# Patient Record
Sex: Female | Born: 1950 | Race: White | Hispanic: No | Marital: Married | Smoking: Never smoker
Health system: Southern US, Community
[De-identification: ages and names within clinical notes are randomized; demographics above are authoritative.]

## PROBLEM LIST (undated history)

## (undated) DIAGNOSIS — E7800 Pure hypercholesterolemia, unspecified: Secondary | ICD-10-CM

## (undated) HISTORY — PX: APPENDECTOMY: SHX54

## (undated) HISTORY — PX: NECK SURGERY: SHX720

## (undated) HISTORY — PX: SHOULDER SURGERY: SHX246

---

## 1997-08-03 ENCOUNTER — Inpatient Hospital Stay (HOSPITAL_COMMUNITY): Admission: EM | Admit: 1997-08-03 | Discharge: 1997-08-07 | Payer: Self-pay | Admitting: Emergency Medicine

## 1998-01-08 ENCOUNTER — Other Ambulatory Visit: Admission: RE | Admit: 1998-01-08 | Discharge: 1998-01-08 | Payer: Self-pay | Admitting: Obstetrics and Gynecology

## 1999-02-06 ENCOUNTER — Other Ambulatory Visit: Admission: RE | Admit: 1999-02-06 | Discharge: 1999-02-06 | Payer: Self-pay | Admitting: Obstetrics and Gynecology

## 2000-02-11 ENCOUNTER — Other Ambulatory Visit: Admission: RE | Admit: 2000-02-11 | Discharge: 2000-02-11 | Payer: Self-pay | Admitting: Obstetrics and Gynecology

## 2001-02-10 ENCOUNTER — Other Ambulatory Visit: Admission: RE | Admit: 2001-02-10 | Discharge: 2001-02-10 | Payer: Self-pay | Admitting: Obstetrics and Gynecology

## 2004-04-28 ENCOUNTER — Other Ambulatory Visit: Admission: RE | Admit: 2004-04-28 | Discharge: 2004-04-28 | Payer: Self-pay | Admitting: Obstetrics and Gynecology

## 2005-11-24 ENCOUNTER — Other Ambulatory Visit: Admission: RE | Admit: 2005-11-24 | Discharge: 2005-11-24 | Payer: Self-pay | Admitting: Obstetrics and Gynecology

## 2013-07-10 ENCOUNTER — Emergency Department (HOSPITAL_COMMUNITY)
Admission: EM | Admit: 2013-07-10 | Discharge: 2013-07-11 | Disposition: A | Discharge status: Home / Self Care | Payer: BC Managed Care – PPO | Attending: Emergency Medicine | Admitting: Emergency Medicine

## 2013-07-10 ENCOUNTER — Encounter (HOSPITAL_COMMUNITY): Payer: Self-pay | Admitting: Emergency Medicine

## 2013-07-10 ENCOUNTER — Emergency Department (HOSPITAL_COMMUNITY): Payer: BC Managed Care – PPO

## 2013-07-10 DIAGNOSIS — R1013 Epigastric pain: Secondary | ICD-10-CM | POA: Insufficient documentation

## 2013-07-10 DIAGNOSIS — E7800 Pure hypercholesterolemia, unspecified: Secondary | ICD-10-CM | POA: Insufficient documentation

## 2013-07-10 DIAGNOSIS — M79609 Pain in unspecified limb: Secondary | ICD-10-CM | POA: Insufficient documentation

## 2013-07-10 DIAGNOSIS — R079 Chest pain, unspecified: Secondary | ICD-10-CM | POA: Insufficient documentation

## 2013-07-10 HISTORY — DX: Pure hypercholesterolemia, unspecified: E78.00

## 2013-07-10 LAB — BASIC METABOLIC PANEL
BUN: 14 mg/dL (ref 6–23)
CHLORIDE: 104 meq/L (ref 96–112)
CO2: 25 mEq/L (ref 19–32)
Calcium: 9.5 mg/dL (ref 8.4–10.5)
Creatinine, Ser: 0.58 mg/dL (ref 0.50–1.10)
GFR calc Af Amer: >90 mL/min (ref >90)
GFR calc non Af Amer: >90 mL/min (ref >90)
Glucose, Bld: 114 mg/dL — ABNORMAL HIGH (ref 70–99)
Potassium: 3.6 mEq/L — ABNORMAL LOW (ref 3.7–5.3)
Sodium: 144 mEq/L (ref 137–147)

## 2013-07-10 LAB — HEPATIC FUNCTION PANEL
ALT: 11 U/L (ref 0–35)
AST: 21 U/L (ref 0–37)
Albumin: 4 g/dL (ref 3.5–5.2)
Alkaline Phosphatase: 50 U/L (ref 39–117)
Total Protein: 6.9 g/dL (ref 6.0–8.3)

## 2013-07-10 LAB — TROPONIN I: Troponin I: <0.3 ng/mL (ref <0.30)

## 2013-07-10 LAB — CBC
HEMATOCRIT: 36.2 % (ref 36.0–46.0)
HEMOGLOBIN: 12.3 g/dL (ref 12.0–15.0)
MCH: 30.9 pg (ref 26.0–34.0)
MCHC: 34 g/dL (ref 30.0–36.0)
MCV: 91 fL (ref 78.0–100.0)
Platelets: 181 10*3/uL (ref 150–400)
RBC: 3.98 MIL/uL (ref 3.87–5.11)
RDW: 12.8 % (ref 11.5–15.5)
WBC: 4.5 10*3/uL (ref 4.0–10.5)

## 2013-07-10 LAB — LIPASE, BLOOD: Lipase: 68 U/L — ABNORMAL HIGH (ref 11–59)

## 2013-07-10 MED ORDER — NITROGLYCERIN 0.4 MG SL SUBL
0.4000 mg | SUBLINGUAL_TABLET | SUBLINGUAL | Status: DC | PRN
  Administered 2013-07-10 (×2): 0.4 mg via SUBLINGUAL
  Filled 2013-07-10: qty 1

## 2013-07-10 NOTE — ED Notes (Addendum)
Patient transported to X-ray 

## 2013-07-10 NOTE — ED Notes (Signed)
Patient stated that she took 325gm ASA this morning and was giving 1 NTG at her PCP and that seemed to ease up the pain but it is coming back again.

## 2013-07-10 NOTE — ED Notes (Signed)
MD at bedside. 

## 2013-07-10 NOTE — ED Notes (Signed)
Patient presents stating that she has had epigastric pain for about 1 week and Saturday her left arm started hurting.  Was seen by her PCP today with a EKG done.

## 2013-07-10 NOTE — ED Provider Notes (Signed)
CSN: [id number]     Arrival date & time 07/10/13  1853 History   First MD Initiated Contact with Patient 07/10/13 1940     Chief Complaint  Patient presents with  . Epigastric pain      (Consider location/radiation/quality/duration/timing/severity/associated sxs/prior Treatment) Patient is a 63 y.o. female presenting with abdominal pain. The history is provided by the patient.  Abdominal Pain Pain location:  Epigastric Pain quality comment:  Tightness Pain radiates to:  Does not radiate Pain severity:  Moderate Onset quality:  Gradual Timing:  Intermittent Progression:  Unchanged Chronicity:  New Relieved by:  Nothing Worsened by:  Nothing tried Associated symptoms: no chest pain, no chills, no cough, no diarrhea, no fever, no nausea, no shortness of breath and no vomiting     63 yo F pw epigastric/central chest pain. onset 1 week ago. Intermittent. Central chest tightness. Not exertional. 2 days ago woke up in middle of night with left arm pain. Felt warm all over. Burning sensation in fingers. Has improved some since then. Saw pcp today who directed to ED.  H/o high cholesterol. Denies other pmh. Non-smoker. Family h/o MI in brother at age 34. Reports negative cath in 2005.  No h/o DVT/PE No leg swelling / pain.  No cough or fever.  Pain not changed with eating.    Past Medical History  Diagnosis Date  . Hypercholesteremia    Past Surgical History  Procedure Laterality Date  . Neck surgery    . Appendectomy    . Shoulder surgery     History reviewed. No pertinent family history. History  Substance Use Topics  . Smoking status: Never Smoker   . Smokeless tobacco: Never Used  . Alcohol Use: No   OB History   Grav Para Term Preterm Abortions TAB SAB Ect Mult Living                 Review of Systems  Constitutional: Negative for fever and chills.  HENT: Negative for congestion and rhinorrhea.   Respiratory: Negative for cough and shortness of breath.    Cardiovascular: Negative for chest pain and leg swelling.  Gastrointestinal: Positive for abdominal pain. Negative for nausea, vomiting and diarrhea.  Genitourinary: Negative for flank pain and difficulty urinating.  Musculoskeletal: Negative for back pain.  Neurological: Negative for dizziness and headaches.  All other systems reviewed and are negative.     Allergies  Review of patient's allergies indicates no known allergies.  Home Medications   Prior to Admission medications   Not on File   BP 137/56  Pulse 74  Temp(Src) 97.8 F (36.6 C) (Oral)  Resp 18  Ht 5' (1.524 m)  Wt 136 lb (61.689 kg)  BMI 26.56 kg/m2  SpO2 100% Physical Exam  Nursing note and vitals reviewed. Constitutional: She is oriented to person, place, and time. She appears well-developed and well-nourished. No distress.  HENT:  Head: Normocephalic and atraumatic.  Eyes: Conjunctivae are normal. Right eye exhibits no discharge. Left eye exhibits no discharge.  Cardiovascular: Normal rate, regular rhythm, normal heart sounds and intact distal pulses.   Pulmonary/Chest: Effort normal and breath sounds normal. No respiratory distress. She has no wheezes. She has no rales. She exhibits no tenderness.  Abdominal: Soft. She exhibits no distension. There is no tenderness. There is no rigidity, no guarding, no CVA tenderness and negative Murphy's sign.  Musculoskeletal: She exhibits no edema and no tenderness.  Neurological: She is alert and oriented to person, place, and time.  Skin: Skin is warm and dry.  Psychiatric: She has a normal mood and affect. Her behavior is normal.    ED Course  Procedures (including critical care time) Labs Review Labs Reviewed  TROPONIN I  CBC  BASIC METABOLIC PANEL    Imaging Review No results found.   EKG Interpretation None      MDM   Final diagnoses:  None    Epigastric pain versus low central chest pain.  EKG reassuring. Heart score 3. CP free after NTG.  Troponin neg x2. F/u cardiology and pcp closely. Strict return precautions. CXR clear. Without evidence of pna, ptx or other emergent pathology.  Pain not changed with eating. Murphy's negative. Labs unremarkable.  abd exam benign.  Could be gastritis vs ulcer. Start zantac.  Lipase minimally elevated. Not cw pancreatitis.  Pt sitting up in bed. NAD. Speaks in full sentences.   Patient discharged home. Return precautions given. To follow up with pcp, cardiology, and GI. Patient and spouse in agreement with plan.  Labs and imaging reviewed by myself and considered in medical decision making if ordered. Imaging interpreted by radiology.   Discussed case with Dr. Kathrynn Humble who is in agreement with assessment and plan.      Bonnita Hollow, MD 07/11/13 [phone]

## 2013-07-11 MED ORDER — RANITIDINE HCL 150 MG PO CAPS: 150.0000 mg | ORAL_CAPSULE | Freq: Every day | ORAL | Status: DC

## 2013-07-11 NOTE — Discharge Instructions (Signed)
Chest Pain (Nonspecific) °It is often hard to give a specific diagnosis for the cause of chest pain. There is always a chance that your pain could be related to something serious, such as a heart attack or a blood clot in the lungs. You need to follow up with your health care provider for further evaluation. °CAUSES  °· Heartburn. °· Pneumonia or bronchitis. °· Anxiety or stress. °· Inflammation around your heart (pericarditis) or lung (pleuritis or pleurisy). °· A blood clot in the lung. °· A collapsed lung (pneumothorax). It can develop suddenly on its own (spontaneous pneumothorax) or from trauma to the chest. °· Shingles infection (herpes zoster virus). °The chest wall is composed of bones, muscles, and cartilage. Any of these can be the source of the pain. °· The bones can be bruised by injury. °· The muscles or cartilage can be strained by coughing or overwork. °· The cartilage can be affected by inflammation and become sore (costochondritis). °DIAGNOSIS  °Lab tests or other studies may be needed to find the cause of your pain. Your health care provider may have you take a test called an ambulatory electrocardiogram (ECG). An ECG records your heartbeat patterns over a 24-hour period. You may also have other tests, such as: °· Transthoracic echocardiogram (TTE). During echocardiography, sound waves are used to evaluate how blood flows through your heart. °· Transesophageal echocardiogram (TEE). °· Cardiac monitoring. This allows your health care provider to monitor your heart rate and rhythm in real time. °· Holter monitor. This is a portable device that records your heartbeat and can help diagnose heart arrhythmias. It allows your health care provider to track your heart activity for several days, if needed. °· Stress tests by exercise or by giving medicine that makes the heart beat faster. °TREATMENT  °· Treatment depends on what may be causing your chest pain. Treatment may include: °¨ Acid blockers for  heartburn. °¨ Anti-inflammatory medicine. °¨ Pain medicine for inflammatory conditions. °¨ Antibiotics if an infection is present. °· You may be advised to change lifestyle habits. This includes stopping smoking and avoiding alcohol, caffeine, and chocolate. °· You may be advised to keep your head raised (elevated) when sleeping. This reduces the chance of acid going backward from your stomach into your esophagus. °Most of the time, nonspecific chest pain will improve within 2-3 days with rest and mild pain medicine.  °HOME CARE INSTRUCTIONS  °· If antibiotics were prescribed, take them as directed. Finish them even if you start to feel better. °· For the next few days, avoid physical activities that bring on chest pain. Continue physical activities as directed. °· Do not use any tobacco products, including cigarettes, chewing tobacco, or electronic cigarettes. °· Avoid drinking alcohol. °· Only take medicine as directed by your health care provider. °· Follow your health care provider's suggestions for further testing if your chest pain does not go away. °· Keep any follow-up appointments you made. If you do not go to an appointment, you could develop lasting (chronic) problems with pain. If there is any problem keeping an appointment, call to reschedule. °SEEK MEDICAL CARE IF:  °· Your chest pain does not go away, even after treatment. °· You have a rash with blisters on your chest. °· You have a fever. °SEEK IMMEDIATE MEDICAL CARE IF:  °· You have increased chest pain or pain that spreads to your arm, neck, jaw, back, or abdomen. °· You have shortness of breath. °· You have an increasing cough, or you cough   up blood.  You have severe back or abdominal pain.  You feel nauseous or vomit.  You have severe weakness.  You faint.  You have chills. This is an emergency. Do not wait to see if the pain will go away. Get medical help at once. Call your local emergency services (911 in [place] Do not drive  yourself to the hospital. MAKE SURE YOU:   Understand these instructions.  Will watch your condition.  Will get help right away if you are not doing well or get worse. Document Released: 10/08/2004 Document Revised: 01/03/2013 Document Reviewed: 08/04/2007 [facility] Patient Information 2015 [place], [place]. This information is not intended to replace advice given to you by your health care provider. Make sure you discuss any questions you have with your health care provider.  Abdominal Pain, Women Abdominal (stomach, pelvic, or belly) pain can be caused by many things. It is important to tell your doctor:  The location of the pain.  Does it come and go or is it present all the time?  Are there things that start the pain (eating certain foods, exercise)?  Are there other symptoms associated with the pain (fever, nausea, vomiting, diarrhea)? All of this is helpful to know when trying to find the cause of the pain. CAUSES   Stomach: virus or bacteria infection, or ulcer.  Intestine: appendicitis (inflamed appendix), regional ileitis (Crohn's disease), ulcerative colitis (inflamed colon), irritable bowel syndrome, diverticulitis (inflamed diverticulum of the colon), or cancer of the stomach or intestine.  Gallbladder disease or stones in the gallbladder.  Kidney disease, kidney stones, or infection.  Pancreas infection or cancer.  Fibromyalgia (pain disorder).  Diseases of the female organs:  Uterus: fibroid (non-cancerous) tumors or infection.  Fallopian tubes: infection or tubal pregnancy.  Ovary: cysts or tumors.  Pelvic adhesions (scar tissue).  Endometriosis (uterus lining tissue growing in the pelvis and on the pelvic organs).  Pelvic congestion syndrome (female organs filling up with blood just before the menstrual period).  Pain with the menstrual period.  Pain with ovulation (producing an egg).  Pain with an IUD (intrauterine device, birth control) in the  uterus.  Cancer of the female organs.  Functional pain (pain not caused by a disease, may improve without treatment).  Psychological pain.  Depression. DIAGNOSIS  Your doctor will decide the seriousness of your pain by doing an examination.  Blood tests.  X-rays.  Ultrasound.  CT scan (computed tomography, special type of X-ray).  MRI (magnetic resonance imaging).  Cultures, for infection.  Barium enema (dye inserted in the large intestine, to better view it with X-rays).  Colonoscopy (looking in intestine with a lighted tube).  Laparoscopy (minor surgery, looking in abdomen with a lighted tube).  Major abdominal exploratory surgery (looking in abdomen with a large incision). TREATMENT  The treatment will depend on the cause of the pain.   Many cases can be observed and treated at home.  Over-the-counter medicines recommended by your caregiver.  Prescription medicine.  Antibiotics, for infection.  Birth control pills, for painful periods or for ovulation pain.  Hormone treatment, for endometriosis.  Nerve blocking injections.  Physical therapy.  Antidepressants.  Counseling with a psychologist or psychiatrist.  Minor or major surgery. HOME CARE INSTRUCTIONS   Do not take laxatives, unless directed by your caregiver.  Take over-the-counter pain medicine only if ordered by your caregiver. Do not take aspirin because it can cause an upset stomach or bleeding.  Try a clear liquid diet (broth or water) as  ordered by your caregiver. Slowly move to a bland diet, as tolerated, if the pain is related to the stomach or intestine.  Have a thermometer and take your temperature several times a day, and record it.  Bed rest and sleep, if it helps the pain.  Avoid sexual intercourse, if it causes pain.  Avoid stressful situations.  Keep your follow-up appointments and tests, as your caregiver orders.  If the pain does not go away with medicine or surgery, you  may try:  Acupuncture.  Relaxation exercises (yoga, meditation).  Group therapy.  Counseling. SEEK MEDICAL CARE IF:   You notice certain foods cause stomach pain.  Your home care treatment is not helping your pain.  You need stronger pain medicine.  You want your IUD removed.  You feel faint or lightheaded.  You develop nausea and vomiting.  You develop a rash.  You are having side effects or an allergy to your medicine. SEEK IMMEDIATE MEDICAL CARE IF:   Your pain does not go away or gets worse.  You have a fever.  Your pain is felt only in portions of the abdomen. The right side could possibly be appendicitis. The left lower portion of the abdomen could be colitis or diverticulitis.  You are passing blood in your stools (bright red or black tarry stools, with or without vomiting).  You have blood in your urine.  You develop chills, with or without a fever.  You pass out. MAKE SURE YOU:   Understand these instructions.  Will watch your condition.  Will get help right away if you are not doing well or get worse. Document Released: 10/26/2006 Document Revised: 03/23/2011 Document Reviewed: 11/15/2008 [facility] Patient Information 2015 [place], [place]. This information is not intended to replace advice given to you by your health care provider. Make sure you discuss any questions you have with your health care provider.

## 2013-07-12 NOTE — ED Provider Notes (Signed)
Medical screening examination/treatment/procedure(s) were performed by non-physician practitioner and as supervising physician I was immediately available for consultation/collaboration.   EKG Interpretation None       Date: 07/12/2013  Rate: 80  Rhythm: normal sinus rhythm  QRS Axis: normal  Intervals: normal  ST/T Wave abnormalities: normal, T wave inversion in v1  Conduction Disutrbances: none  Narrative Interpretation: no comparison ekg.  Pt with epigastric chest pain, radiating to back. Cardiovascular exam is normal. Plan is to get HEART score, and if < 3, get troponin x 2 -  Which if neg, d/c with GI and Cards f/u. Pt has lipase of 68 - and i suspect there might be an element of PUD based on hx.      Varney Biles, MD 07/12/13 [phone]

## 2015-05-23 DIAGNOSIS — K5900 Constipation, unspecified: Secondary | ICD-10-CM | POA: Diagnosis not present

## 2015-05-23 DIAGNOSIS — R110 Nausea: Secondary | ICD-10-CM | POA: Diagnosis not present

## 2015-06-05 DIAGNOSIS — R1011 Right upper quadrant pain: Secondary | ICD-10-CM | POA: Diagnosis not present

## 2015-06-05 DIAGNOSIS — R198 Other specified symptoms and signs involving the digestive system and abdomen: Secondary | ICD-10-CM | POA: Diagnosis not present

## 2015-06-05 DIAGNOSIS — R110 Nausea: Secondary | ICD-10-CM | POA: Diagnosis not present

## 2015-06-05 DIAGNOSIS — R1013 Epigastric pain: Secondary | ICD-10-CM | POA: Diagnosis not present

## 2015-06-07 DIAGNOSIS — Z1239 Encounter for other screening for malignant neoplasm of breast: Secondary | ICD-10-CM | POA: Diagnosis not present

## 2015-06-07 DIAGNOSIS — Z01419 Encounter for gynecological examination (general) (routine) without abnormal findings: Secondary | ICD-10-CM | POA: Diagnosis not present

## 2015-06-12 DIAGNOSIS — R1013 Epigastric pain: Secondary | ICD-10-CM | POA: Diagnosis not present

## 2015-06-12 DIAGNOSIS — R110 Nausea: Secondary | ICD-10-CM | POA: Diagnosis not present

## 2015-06-17 DIAGNOSIS — K2950 Unspecified chronic gastritis without bleeding: Secondary | ICD-10-CM | POA: Diagnosis not present

## 2015-06-17 DIAGNOSIS — K581 Irritable bowel syndrome with constipation: Secondary | ICD-10-CM | POA: Diagnosis not present

## 2015-06-17 DIAGNOSIS — E559 Vitamin D deficiency, unspecified: Secondary | ICD-10-CM | POA: Diagnosis not present

## 2015-06-17 DIAGNOSIS — R110 Nausea: Secondary | ICD-10-CM | POA: Diagnosis not present

## 2015-06-17 DIAGNOSIS — M8580 Other specified disorders of bone density and structure, unspecified site: Secondary | ICD-10-CM | POA: Diagnosis not present

## 2015-06-17 DIAGNOSIS — G4700 Insomnia, unspecified: Secondary | ICD-10-CM | POA: Diagnosis not present

## 2015-06-17 DIAGNOSIS — Z9049 Acquired absence of other specified parts of digestive tract: Secondary | ICD-10-CM | POA: Diagnosis not present

## 2015-06-17 DIAGNOSIS — K259 Gastric ulcer, unspecified as acute or chronic, without hemorrhage or perforation: Secondary | ICD-10-CM | POA: Diagnosis not present

## 2015-06-17 DIAGNOSIS — R1013 Epigastric pain: Secondary | ICD-10-CM | POA: Diagnosis not present

## 2015-06-17 DIAGNOSIS — K253 Acute gastric ulcer without hemorrhage or perforation: Secondary | ICD-10-CM | POA: Diagnosis not present

## 2015-06-17 DIAGNOSIS — Z79899 Other long term (current) drug therapy: Secondary | ICD-10-CM | POA: Diagnosis not present

## 2015-06-17 DIAGNOSIS — K219 Gastro-esophageal reflux disease without esophagitis: Secondary | ICD-10-CM | POA: Diagnosis not present

## 2015-06-17 DIAGNOSIS — E785 Hyperlipidemia, unspecified: Secondary | ICD-10-CM | POA: Diagnosis not present

## 2015-06-17 DIAGNOSIS — G43909 Migraine, unspecified, not intractable, without status migrainosus: Secondary | ICD-10-CM | POA: Diagnosis not present

## 2015-06-21 DIAGNOSIS — R300 Dysuria: Secondary | ICD-10-CM | POA: Diagnosis not present

## 2015-06-21 DIAGNOSIS — A499 Bacterial infection, unspecified: Secondary | ICD-10-CM | POA: Diagnosis not present

## 2015-06-21 DIAGNOSIS — N390 Urinary tract infection, site not specified: Secondary | ICD-10-CM | POA: Diagnosis not present

## 2015-06-23 DIAGNOSIS — N3090 Cystitis, unspecified without hematuria: Secondary | ICD-10-CM | POA: Diagnosis not present

## 2015-06-23 DIAGNOSIS — N3001 Acute cystitis with hematuria: Secondary | ICD-10-CM | POA: Diagnosis not present

## 2015-07-02 DIAGNOSIS — Z1231 Encounter for screening mammogram for malignant neoplasm of breast: Secondary | ICD-10-CM | POA: Diagnosis not present

## 2015-07-26 DIAGNOSIS — N390 Urinary tract infection, site not specified: Secondary | ICD-10-CM | POA: Diagnosis not present

## 2015-07-26 DIAGNOSIS — Z0000 Encounter for general adult medical examination without abnormal findings: Secondary | ICD-10-CM | POA: Diagnosis not present

## 2015-07-26 DIAGNOSIS — Z13820 Encounter for screening for osteoporosis: Secondary | ICD-10-CM | POA: Diagnosis not present

## 2015-07-26 DIAGNOSIS — E785 Hyperlipidemia, unspecified: Secondary | ICD-10-CM | POA: Diagnosis not present

## 2015-07-26 DIAGNOSIS — Z23 Encounter for immunization: Secondary | ICD-10-CM | POA: Diagnosis not present

## 2015-07-26 DIAGNOSIS — M8580 Other specified disorders of bone density and structure, unspecified site: Secondary | ICD-10-CM | POA: Diagnosis not present

## 2015-07-26 DIAGNOSIS — E559 Vitamin D deficiency, unspecified: Secondary | ICD-10-CM | POA: Diagnosis not present

## 2015-07-26 DIAGNOSIS — Z1389 Encounter for screening for other disorder: Secondary | ICD-10-CM | POA: Diagnosis not present

## 2015-07-26 DIAGNOSIS — Z9181 History of falling: Secondary | ICD-10-CM | POA: Diagnosis not present

## 2015-07-26 DIAGNOSIS — K219 Gastro-esophageal reflux disease without esophagitis: Secondary | ICD-10-CM | POA: Diagnosis not present

## 2015-09-13 DIAGNOSIS — Z9181 History of falling: Secondary | ICD-10-CM | POA: Diagnosis not present

## 2015-09-13 DIAGNOSIS — Z0000 Encounter for general adult medical examination without abnormal findings: Secondary | ICD-10-CM | POA: Diagnosis not present

## 2015-09-20 DIAGNOSIS — R590 Localized enlarged lymph nodes: Secondary | ICD-10-CM | POA: Diagnosis not present

## 2015-09-24 DIAGNOSIS — R221 Localized swelling, mass and lump, neck: Secondary | ICD-10-CM | POA: Diagnosis not present

## 2015-09-24 DIAGNOSIS — R591 Generalized enlarged lymph nodes: Secondary | ICD-10-CM | POA: Diagnosis not present

## 2015-10-04 DIAGNOSIS — M8580 Other specified disorders of bone density and structure, unspecified site: Secondary | ICD-10-CM | POA: Diagnosis not present

## 2015-10-04 DIAGNOSIS — N959 Unspecified menopausal and perimenopausal disorder: Secondary | ICD-10-CM | POA: Diagnosis not present

## 2015-10-04 DIAGNOSIS — M85862 Other specified disorders of bone density and structure, left lower leg: Secondary | ICD-10-CM | POA: Diagnosis not present

## 2015-10-10 DIAGNOSIS — R591 Generalized enlarged lymph nodes: Secondary | ICD-10-CM | POA: Diagnosis not present

## 2015-10-10 DIAGNOSIS — R221 Localized swelling, mass and lump, neck: Secondary | ICD-10-CM | POA: Diagnosis not present

## 2015-10-16 DIAGNOSIS — R221 Localized swelling, mass and lump, neck: Secondary | ICD-10-CM | POA: Diagnosis not present

## 2015-10-16 DIAGNOSIS — R591 Generalized enlarged lymph nodes: Secondary | ICD-10-CM | POA: Diagnosis not present

## 2015-10-22 DIAGNOSIS — Z23 Encounter for immunization: Secondary | ICD-10-CM | POA: Diagnosis not present

## 2015-11-01 DIAGNOSIS — R591 Generalized enlarged lymph nodes: Secondary | ICD-10-CM | POA: Diagnosis not present

## 2015-11-06 DIAGNOSIS — R591 Generalized enlarged lymph nodes: Secondary | ICD-10-CM | POA: Diagnosis not present

## 2016-02-26 ENCOUNTER — Ambulatory Visit (INDEPENDENT_AMBULATORY_CARE_PROVIDER_SITE_OTHER): Payer: Medicare Other | Admitting: Neurology

## 2016-02-26 ENCOUNTER — Encounter: Payer: Self-pay | Admitting: Neurology

## 2016-02-26 VITALS — BP 118/70 | HR 67 | Ht 60.0 in | Wt 130.2 lb

## 2016-02-26 DIAGNOSIS — K0889 Other specified disorders of teeth and supporting structures: Secondary | ICD-10-CM

## 2016-02-26 NOTE — Progress Notes (Signed)
Amy Bailey - Initial Visit   Date: 02/26/16  Amy Bailey MRN: [id number] DOB: 1950/09/25   Dear Amy Bailey:  Thank you for your kind referral of Amy Bailey for consultation of left teeth pain. Although her history is well known to you, please allow [place] to reiterate it for the purpose of our medical record. The patient was accompanied to the clinic by self.   History of Present Illness: Amy Bailey is a 66 y.o. right-handed Caucasian female with hyperlipidemia and GERD presenting for evaluation of left teeth pain.   Starting in 2014, she had left achy pain at teeth number 9, 10, and 11.  She saw dentist who performed a root canal on teeth #9 and 11; however, symptoms became worse after this. She feels like she has a constant tooth ache.  She denies tenderness of the gums.   She saw endodontist and did not find anything wrong. She also had her bite adjusted which did not help.  Symptoms are constant and can be worse on other days.  The soreness is worse with chewing and biting.  Her pain is exacerbated by applying pressure on these teeth, such as when clenching or manually pushing on the teeth.  She tried tylenol and ibuprofen with out improvement.   She denies sharp, shooting, electrical, numbness/tingling of the left cheek or face.  She denies difficulty swallowing and talking or change in taste.    She has been seeing Amy Bailey, dentist, who referred her to see me for possible trigeminal neuralgia.   Past Medical History:  Diagnosis Date  . Hypercholesteremia     Past Surgical History:  Procedure Laterality Date  . APPENDECTOMY    . NECK SURGERY    . SHOULDER SURGERY       Medications:  Outpatient Encounter Prescriptions as of 02/26/2016  Medication Sig Bailey  . Calcium Carb-Cholecalciferol (CALCIUM 600 + D PO) Take 1 tablet by mouth daily.   Amy Bailey Calcium (STOOL SOFTENER PO) Take 2 tablets by mouth daily.  07/10/2013: Patient only took 1 today  . Omega-3 Fatty Acids (FISH OIL PO) Take 2 tablets by mouth daily. 07/10/2013: Patient only took 1 today  . ranitidine (ZANTAC) 150 MG capsule Take 1 capsule (150 mg total) by mouth daily.   Marland Kitchen aspirin 325 MG tablet Take 325 mg by mouth daily as needed for moderate pain.   Marland Kitchen PRESCRIPTION MEDICATION Take 1 tablet by mouth daily. 07/10/2013: Patient is unsure of name or strength of medication, used for cholesterol. Pharmacy is closed for the night.   No facility-administered encounter medications on file as of 02/26/2016.      Allergies: No Known Allergies  Family History: Family History  Problem Relation Age of Onset  . Pancreatic cancer Mother   . Diabetes Mellitus I Mother   . Hypertension Father   . Heart attack Father   . Diabetes Mellitus I Brother   . Heart attack Brother     Social History: Social History  Substance Use Topics  . Smoking status: Never Smoker  . Smokeless tobacco: Never Used  . Alcohol use No   Social History   Social History Narrative   She lives with her husband.    She retired in 2014 at a [employer] level of education:  10th    Review of Systems:  CONSTITUTIONAL: No fevers, chills, night sweats, or weight loss.   EYES: No visual changes or  eye pain ENT: No hearing changes.  No history of nose bleeds.  +tooth ache RESPIRATORY: No cough, wheezing and shortness of breath.   CARDIOVASCULAR: Negative for chest pain, and palpitations.   GI: Negative for abdominal discomfort, blood in stools or black stools.  No recent change in bowel habits.   GU:  No history of incontinence.   MUSCLOSKELETAL: No history of joint pain or swelling.  No myalgias.   SKIN: Negative for lesions, rash, and itching.   HEMATOLOGY/ONCOLOGY: Negative for prolonged bleeding, bruising easily, and swollen nodes.  No history of cancer.   ENDOCRINE: Negative for cold or heat intolerance, polydipsia or goiter.   PSYCH:   No depression or anxiety symptoms.   NEURO: As Above.   Vital Signs:  BP 118/70   Pulse 67   Ht 5' (1.524 m)   Wt 130 lb 4 oz (59.1 kg)   SpO2 99%   BMI 25.44 kg/m    General Medical Exam:   General:  Well appearing, comfortable.   Eyes/ENT: see cranial nerve examination..  She has tenderness when pressure is applied to the left teeth 10 and 11.  There is no tenderness over the gums.    Neurological Exam: MENTAL STATUS including orientation to time, place, person, recent and remote memory, attention span and concentration, language, and fund of knowledge is normal.  Speech is not dysarthric.  CRANIAL NERVES: II:  No visual field defects. III-IV-VI: Pupils equal round and reactive to light.  Normal conjugate, extra-ocular eye movements in all directions of gaze.  No nystagmus.  No ptosis.   V:  Normal facial sensation to temperature, light touch, and pin prick.  Jaw jerk is absent.   VII:  Normal facial symmetry and movements.  No pathologic facial reflexes.  VIII:  Normal hearing and vestibular function.   IX-X:  Normal palatal movement.   XI:  Normal shoulder shrug and head rotation.   XII:  Normal tongue strength and range of motion, no deviation or fasciculation.  MOTOR: Motor strength is 5/5 throughout.  No atrophy, fasciculations or abnormal movements.  No pronator drift.  Tone is normal.    MSRs: Reflexes are brisk and 2+/4 throughout.    SENSORY:  Normal and symmetric perception of light touch, pinprick, vibration, and proprioception.   COORDINATION/GAIT: Normal finger-to- nose-finger.  Gait narrow based and stable. Tandem and stressed gait intact.    IMPRESSION: Amy Bailey is a 66 year-old female referred for evaluation of left teeth pain. Her exam is entirely normal and non-focal.  With the absence of paresthesias and spells of severe lancinating pain, I do not see any evidence by history or exam for a primary neurological cause for her discomfort, including  trigeminal neuralgia. Patient was reassured that there is no need for any neurological work-up and she should follow-up with her dentist for ongoing management.   The duration of this appointment visit was 25 minutes of face-to-face time with the patient.  Greater than 50% of this time was spent in counseling, explanation of diagnosis, planning of further management, and coordination of care.   Thank you for allowing me to participate in patient's care.  If I can answer any additional questions, I would be pleased to do so.    Sincerely,    Donika K. Posey Pronto, DO

## 2016-02-26 NOTE — Patient Instructions (Signed)
I do not see any evidence of trigeminal neuralgia or any other neurological cause for your your tooth ache.

## 2016-02-27 NOTE — Progress Notes (Signed)
Note faxed.

## 2016-03-05 DIAGNOSIS — K0530 Chronic periodontitis, unspecified: Secondary | ICD-10-CM | POA: Diagnosis not present

## 2016-03-05 DIAGNOSIS — J3489 Other specified disorders of nose and nasal sinuses: Secondary | ICD-10-CM | POA: Diagnosis not present

## 2016-03-05 DIAGNOSIS — R6884 Jaw pain: Secondary | ICD-10-CM | POA: Diagnosis not present

## 2016-03-05 DIAGNOSIS — R590 Localized enlarged lymph nodes: Secondary | ICD-10-CM | POA: Diagnosis not present

## 2016-04-29 ENCOUNTER — Ambulatory Visit: Payer: Self-pay | Admitting: Neurology

## 2016-05-22 DIAGNOSIS — N3001 Acute cystitis with hematuria: Secondary | ICD-10-CM | POA: Diagnosis not present

## 2016-05-22 DIAGNOSIS — N3090 Cystitis, unspecified without hematuria: Secondary | ICD-10-CM | POA: Diagnosis not present

## 2016-07-07 DIAGNOSIS — R102 Pelvic and perineal pain: Secondary | ICD-10-CM | POA: Diagnosis not present

## 2016-07-13 DIAGNOSIS — N8111 Cystocele, midline: Secondary | ICD-10-CM | POA: Diagnosis not present

## 2016-07-23 DIAGNOSIS — H5203 Hypermetropia, bilateral: Secondary | ICD-10-CM | POA: Diagnosis not present

## 2016-07-23 DIAGNOSIS — H43392 Other vitreous opacities, left eye: Secondary | ICD-10-CM | POA: Diagnosis not present

## 2016-07-23 DIAGNOSIS — H524 Presbyopia: Secondary | ICD-10-CM | POA: Diagnosis not present

## 2016-07-23 DIAGNOSIS — H11021 Central pterygium of right eye: Secondary | ICD-10-CM | POA: Diagnosis not present

## 2016-07-23 DIAGNOSIS — H52223 Regular astigmatism, bilateral: Secondary | ICD-10-CM | POA: Diagnosis not present

## 2016-07-23 DIAGNOSIS — H43812 Vitreous degeneration, left eye: Secondary | ICD-10-CM | POA: Diagnosis not present

## 2016-08-24 DIAGNOSIS — N993 Prolapse of vaginal vault after hysterectomy: Secondary | ICD-10-CM | POA: Diagnosis not present

## 2016-08-24 DIAGNOSIS — N952 Postmenopausal atrophic vaginitis: Secondary | ICD-10-CM | POA: Diagnosis not present

## 2016-09-21 DIAGNOSIS — N952 Postmenopausal atrophic vaginitis: Secondary | ICD-10-CM | POA: Diagnosis not present

## 2016-09-21 DIAGNOSIS — N812 Incomplete uterovaginal prolapse: Secondary | ICD-10-CM | POA: Diagnosis not present

## 2016-09-24 DIAGNOSIS — Z136 Encounter for screening for cardiovascular disorders: Secondary | ICD-10-CM | POA: Diagnosis not present

## 2016-09-24 DIAGNOSIS — E559 Vitamin D deficiency, unspecified: Secondary | ICD-10-CM | POA: Diagnosis not present

## 2016-09-24 DIAGNOSIS — E785 Hyperlipidemia, unspecified: Secondary | ICD-10-CM | POA: Diagnosis not present

## 2016-09-24 DIAGNOSIS — Z1389 Encounter for screening for other disorder: Secondary | ICD-10-CM | POA: Diagnosis not present

## 2016-09-24 DIAGNOSIS — Z0000 Encounter for general adult medical examination without abnormal findings: Secondary | ICD-10-CM | POA: Diagnosis not present

## 2016-10-02 DIAGNOSIS — Z1231 Encounter for screening mammogram for malignant neoplasm of breast: Secondary | ICD-10-CM | POA: Diagnosis not present

## 2016-10-05 DIAGNOSIS — Z1211 Encounter for screening for malignant neoplasm of colon: Secondary | ICD-10-CM | POA: Diagnosis not present

## 2016-10-20 DIAGNOSIS — Z23 Encounter for immunization: Secondary | ICD-10-CM | POA: Diagnosis not present

## 2016-11-10 DIAGNOSIS — R319 Hematuria, unspecified: Secondary | ICD-10-CM | POA: Diagnosis not present

## 2016-11-10 DIAGNOSIS — N814 Uterovaginal prolapse, unspecified: Secondary | ICD-10-CM | POA: Diagnosis not present

## 2016-11-10 DIAGNOSIS — N952 Postmenopausal atrophic vaginitis: Secondary | ICD-10-CM | POA: Diagnosis not present

## 2016-11-10 DIAGNOSIS — N812 Incomplete uterovaginal prolapse: Secondary | ICD-10-CM | POA: Diagnosis not present

## 2016-11-24 DIAGNOSIS — E785 Hyperlipidemia, unspecified: Secondary | ICD-10-CM | POA: Diagnosis not present

## 2016-11-24 DIAGNOSIS — K219 Gastro-esophageal reflux disease without esophagitis: Secondary | ICD-10-CM | POA: Diagnosis not present

## 2016-11-24 DIAGNOSIS — N72 Inflammatory disease of cervix uteri: Secondary | ICD-10-CM | POA: Diagnosis not present

## 2016-11-24 DIAGNOSIS — N800 Endometriosis of uterus: Secondary | ICD-10-CM | POA: Diagnosis not present

## 2016-11-24 DIAGNOSIS — Z79899 Other long term (current) drug therapy: Secondary | ICD-10-CM | POA: Diagnosis not present

## 2016-11-24 DIAGNOSIS — M8580 Other specified disorders of bone density and structure, unspecified site: Secondary | ICD-10-CM | POA: Diagnosis not present

## 2016-11-24 DIAGNOSIS — N840 Polyp of corpus uteri: Secondary | ICD-10-CM | POA: Diagnosis not present

## 2016-11-24 DIAGNOSIS — E559 Vitamin D deficiency, unspecified: Secondary | ICD-10-CM | POA: Diagnosis not present

## 2016-11-24 DIAGNOSIS — G43909 Migraine, unspecified, not intractable, without status migrainosus: Secondary | ICD-10-CM | POA: Diagnosis not present

## 2016-11-24 DIAGNOSIS — N812 Incomplete uterovaginal prolapse: Secondary | ICD-10-CM | POA: Diagnosis not present

## 2016-11-24 DIAGNOSIS — N814 Uterovaginal prolapse, unspecified: Secondary | ICD-10-CM | POA: Diagnosis not present

## 2016-11-24 DIAGNOSIS — D259 Leiomyoma of uterus, unspecified: Secondary | ICD-10-CM | POA: Diagnosis not present

## 2016-11-25 DIAGNOSIS — M8580 Other specified disorders of bone density and structure, unspecified site: Secondary | ICD-10-CM | POA: Diagnosis not present

## 2016-11-25 DIAGNOSIS — N814 Uterovaginal prolapse, unspecified: Secondary | ICD-10-CM | POA: Diagnosis not present

## 2016-11-25 DIAGNOSIS — G43909 Migraine, unspecified, not intractable, without status migrainosus: Secondary | ICD-10-CM | POA: Diagnosis not present

## 2016-11-25 DIAGNOSIS — E785 Hyperlipidemia, unspecified: Secondary | ICD-10-CM | POA: Diagnosis not present

## 2016-11-25 DIAGNOSIS — E559 Vitamin D deficiency, unspecified: Secondary | ICD-10-CM | POA: Diagnosis not present

## 2016-11-25 DIAGNOSIS — K219 Gastro-esophageal reflux disease without esophagitis: Secondary | ICD-10-CM | POA: Diagnosis not present

## 2016-11-28 DIAGNOSIS — R109 Unspecified abdominal pain: Secondary | ICD-10-CM | POA: Diagnosis not present

## 2016-11-28 DIAGNOSIS — K219 Gastro-esophageal reflux disease without esophagitis: Secondary | ICD-10-CM | POA: Diagnosis not present

## 2016-11-28 DIAGNOSIS — R300 Dysuria: Secondary | ICD-10-CM | POA: Diagnosis not present

## 2016-11-28 DIAGNOSIS — K5904 Chronic idiopathic constipation: Secondary | ICD-10-CM | POA: Diagnosis not present

## 2016-11-28 DIAGNOSIS — K5900 Constipation, unspecified: Secondary | ICD-10-CM | POA: Diagnosis not present

## 2016-11-28 DIAGNOSIS — E785 Hyperlipidemia, unspecified: Secondary | ICD-10-CM | POA: Diagnosis not present

## 2016-11-28 DIAGNOSIS — G8918 Other acute postprocedural pain: Secondary | ICD-10-CM | POA: Diagnosis not present

## 2016-11-28 DIAGNOSIS — R339 Retention of urine, unspecified: Secondary | ICD-10-CM | POA: Diagnosis not present

## 2016-11-28 DIAGNOSIS — Z79899 Other long term (current) drug therapy: Secondary | ICD-10-CM | POA: Diagnosis not present

## 2016-12-08 DIAGNOSIS — R300 Dysuria: Secondary | ICD-10-CM | POA: Diagnosis not present

## 2016-12-28 DIAGNOSIS — Z9889 Other specified postprocedural states: Secondary | ICD-10-CM | POA: Diagnosis not present

## 2016-12-28 DIAGNOSIS — R8290 Unspecified abnormal findings in urine: Secondary | ICD-10-CM | POA: Diagnosis not present

## 2016-12-28 DIAGNOSIS — N949 Unspecified condition associated with female genital organs and menstrual cycle: Secondary | ICD-10-CM | POA: Diagnosis not present

## 2016-12-28 DIAGNOSIS — N812 Incomplete uterovaginal prolapse: Secondary | ICD-10-CM | POA: Diagnosis not present

## 2017-02-15 DIAGNOSIS — R8290 Unspecified abnormal findings in urine: Secondary | ICD-10-CM | POA: Diagnosis not present

## 2017-06-28 DIAGNOSIS — R102 Pelvic and perineal pain: Secondary | ICD-10-CM | POA: Diagnosis not present

## 2017-07-09 DIAGNOSIS — E559 Vitamin D deficiency, unspecified: Secondary | ICD-10-CM | POA: Diagnosis not present

## 2017-07-09 DIAGNOSIS — G4700 Insomnia, unspecified: Secondary | ICD-10-CM | POA: Diagnosis not present

## 2017-07-09 DIAGNOSIS — Z6823 Body mass index (BMI) 23.0-23.9, adult: Secondary | ICD-10-CM | POA: Diagnosis not present

## 2017-07-09 DIAGNOSIS — E782 Mixed hyperlipidemia: Secondary | ICD-10-CM | POA: Diagnosis not present

## 2017-10-06 DIAGNOSIS — Z1231 Encounter for screening mammogram for malignant neoplasm of breast: Secondary | ICD-10-CM | POA: Diagnosis not present

## 2017-10-15 DIAGNOSIS — Z23 Encounter for immunization: Secondary | ICD-10-CM | POA: Diagnosis not present

## 2017-11-16 DIAGNOSIS — S46811A Strain of other muscles, fascia and tendons at shoulder and upper arm level, right arm, initial encounter: Secondary | ICD-10-CM | POA: Diagnosis not present

## 2017-11-16 DIAGNOSIS — M542 Cervicalgia: Secondary | ICD-10-CM | POA: Diagnosis not present

## 2017-11-16 DIAGNOSIS — Z6822 Body mass index (BMI) 22.0-22.9, adult: Secondary | ICD-10-CM | POA: Diagnosis not present

## 2017-11-16 DIAGNOSIS — Z0000 Encounter for general adult medical examination without abnormal findings: Secondary | ICD-10-CM | POA: Diagnosis not present

## 2017-12-27 DIAGNOSIS — N952 Postmenopausal atrophic vaginitis: Secondary | ICD-10-CM | POA: Diagnosis not present

## 2017-12-27 DIAGNOSIS — Z90710 Acquired absence of both cervix and uterus: Secondary | ICD-10-CM | POA: Diagnosis not present

## 2017-12-27 DIAGNOSIS — Z90722 Acquired absence of ovaries, bilateral: Secondary | ICD-10-CM | POA: Diagnosis not present

## 2017-12-27 DIAGNOSIS — R3989 Other symptoms and signs involving the genitourinary system: Secondary | ICD-10-CM | POA: Diagnosis not present

## 2018-02-28 DIAGNOSIS — H1132 Conjunctival hemorrhage, left eye: Secondary | ICD-10-CM | POA: Diagnosis not present

## 2018-06-03 DIAGNOSIS — Z6822 Body mass index (BMI) 22.0-22.9, adult: Secondary | ICD-10-CM | POA: Diagnosis not present

## 2018-06-03 DIAGNOSIS — H6092 Unspecified otitis externa, left ear: Secondary | ICD-10-CM | POA: Diagnosis not present

## 2018-08-12 ENCOUNTER — Other Ambulatory Visit: Payer: Self-pay

## 2018-10-20 DIAGNOSIS — Z23 Encounter for immunization: Secondary | ICD-10-CM | POA: Diagnosis not present

## 2018-11-11 DIAGNOSIS — E559 Vitamin D deficiency, unspecified: Secondary | ICD-10-CM | POA: Diagnosis not present

## 2018-11-11 DIAGNOSIS — Z79899 Other long term (current) drug therapy: Secondary | ICD-10-CM | POA: Diagnosis not present

## 2018-11-11 DIAGNOSIS — R739 Hyperglycemia, unspecified: Secondary | ICD-10-CM | POA: Diagnosis not present

## 2018-11-11 DIAGNOSIS — E782 Mixed hyperlipidemia: Secondary | ICD-10-CM | POA: Diagnosis not present

## 2018-11-11 DIAGNOSIS — K582 Mixed irritable bowel syndrome: Secondary | ICD-10-CM | POA: Diagnosis not present

## 2018-11-11 DIAGNOSIS — R634 Abnormal weight loss: Secondary | ICD-10-CM | POA: Diagnosis not present

## 2018-11-18 DIAGNOSIS — Z1231 Encounter for screening mammogram for malignant neoplasm of breast: Secondary | ICD-10-CM | POA: Diagnosis not present

## 2018-12-05 DIAGNOSIS — Z0000 Encounter for general adult medical examination without abnormal findings: Secondary | ICD-10-CM | POA: Diagnosis not present

## 2018-12-05 DIAGNOSIS — K582 Mixed irritable bowel syndrome: Secondary | ICD-10-CM | POA: Diagnosis not present

## 2018-12-05 DIAGNOSIS — R634 Abnormal weight loss: Secondary | ICD-10-CM | POA: Diagnosis not present

## 2018-12-05 DIAGNOSIS — R1084 Generalized abdominal pain: Secondary | ICD-10-CM | POA: Diagnosis not present

## 2018-12-05 DIAGNOSIS — Z6821 Body mass index (BMI) 21.0-21.9, adult: Secondary | ICD-10-CM | POA: Diagnosis not present

## 2018-12-13 DIAGNOSIS — N200 Calculus of kidney: Secondary | ICD-10-CM | POA: Diagnosis not present

## 2018-12-13 DIAGNOSIS — R1084 Generalized abdominal pain: Secondary | ICD-10-CM | POA: Diagnosis not present

## 2019-03-21 DIAGNOSIS — I809 Phlebitis and thrombophlebitis of unspecified site: Secondary | ICD-10-CM | POA: Diagnosis not present

## 2019-03-21 DIAGNOSIS — Z6820 Body mass index (BMI) 20.0-20.9, adult: Secondary | ICD-10-CM | POA: Diagnosis not present

## 2019-10-04 DIAGNOSIS — H6691 Otitis media, unspecified, right ear: Secondary | ICD-10-CM | POA: Diagnosis not present

## 2019-10-04 DIAGNOSIS — H60391 Other infective otitis externa, right ear: Secondary | ICD-10-CM | POA: Diagnosis not present

## 2019-10-25 DIAGNOSIS — Z23 Encounter for immunization: Secondary | ICD-10-CM | POA: Diagnosis not present

## 2019-12-19 DIAGNOSIS — Z1231 Encounter for screening mammogram for malignant neoplasm of breast: Secondary | ICD-10-CM | POA: Diagnosis not present

## 2019-12-21 DIAGNOSIS — G720 Drug-induced myopathy: Secondary | ICD-10-CM | POA: Diagnosis not present

## 2019-12-21 DIAGNOSIS — E782 Mixed hyperlipidemia: Secondary | ICD-10-CM | POA: Diagnosis not present

## 2019-12-21 DIAGNOSIS — Z0000 Encounter for general adult medical examination without abnormal findings: Secondary | ICD-10-CM | POA: Diagnosis not present

## 2019-12-21 DIAGNOSIS — G4700 Insomnia, unspecified: Secondary | ICD-10-CM | POA: Diagnosis not present

## 2019-12-21 DIAGNOSIS — K582 Mixed irritable bowel syndrome: Secondary | ICD-10-CM | POA: Diagnosis not present

## 2019-12-25 DIAGNOSIS — E782 Mixed hyperlipidemia: Secondary | ICD-10-CM | POA: Diagnosis not present

## 2019-12-25 DIAGNOSIS — R7302 Impaired glucose tolerance (oral): Secondary | ICD-10-CM | POA: Diagnosis not present

## 2019-12-25 DIAGNOSIS — Z79899 Other long term (current) drug therapy: Secondary | ICD-10-CM | POA: Diagnosis not present

## 2019-12-25 DIAGNOSIS — E559 Vitamin D deficiency, unspecified: Secondary | ICD-10-CM | POA: Diagnosis not present

## 2020-01-11 DIAGNOSIS — R509 Fever, unspecified: Secondary | ICD-10-CM | POA: Diagnosis not present

## 2020-01-11 DIAGNOSIS — Z20828 Contact with and (suspected) exposure to other viral communicable diseases: Secondary | ICD-10-CM | POA: Diagnosis not present

## 2020-01-25 DIAGNOSIS — N6311 Unspecified lump in the right breast, upper outer quadrant: Secondary | ICD-10-CM | POA: Diagnosis not present

## 2020-01-25 DIAGNOSIS — R921 Mammographic calcification found on diagnostic imaging of breast: Secondary | ICD-10-CM | POA: Diagnosis not present

## 2020-01-25 DIAGNOSIS — N6489 Other specified disorders of breast: Secondary | ICD-10-CM | POA: Diagnosis not present

## 2020-02-01 ENCOUNTER — Other Ambulatory Visit: Payer: Self-pay | Admitting: Family Medicine

## 2020-02-01 DIAGNOSIS — R928 Other abnormal and inconclusive findings on diagnostic imaging of breast: Secondary | ICD-10-CM

## 2020-02-19 ENCOUNTER — Other Ambulatory Visit: Payer: Self-pay | Admitting: Diagnostic Radiology

## 2020-02-19 ENCOUNTER — Other Ambulatory Visit: Payer: Self-pay

## 2020-02-19 ENCOUNTER — Ambulatory Visit
Admission: RE | Admit: 2020-02-19 | Discharge: 2020-02-19 | Disposition: A | Discharge status: Home / Self Care | Payer: Medicare Other | Source: Ambulatory Visit | Attending: Family Medicine | Admitting: Family Medicine

## 2020-02-19 DIAGNOSIS — R928 Other abnormal and inconclusive findings on diagnostic imaging of breast: Secondary | ICD-10-CM

## 2020-02-19 DIAGNOSIS — N6489 Other specified disorders of breast: Secondary | ICD-10-CM | POA: Diagnosis not present

## 2020-03-15 ENCOUNTER — Other Ambulatory Visit: Payer: Self-pay | Admitting: General Surgery

## 2020-03-15 DIAGNOSIS — R928 Other abnormal and inconclusive findings on diagnostic imaging of breast: Secondary | ICD-10-CM | POA: Diagnosis not present

## 2020-03-19 ENCOUNTER — Other Ambulatory Visit: Payer: Self-pay | Admitting: General Surgery

## 2020-03-19 DIAGNOSIS — R928 Other abnormal and inconclusive findings on diagnostic imaging of breast: Secondary | ICD-10-CM

## 2020-04-22 ENCOUNTER — Other Ambulatory Visit: Payer: Self-pay

## 2020-04-22 ENCOUNTER — Encounter (HOSPITAL_BASED_OUTPATIENT_CLINIC_OR_DEPARTMENT_OTHER): Payer: Self-pay | Admitting: General Surgery

## 2020-04-29 ENCOUNTER — Other Ambulatory Visit (HOSPITAL_COMMUNITY)
Admission: RE | Admit: 2020-04-29 | Discharge: 2020-04-29 | Disposition: A | Discharge status: Home / Self Care | Payer: Medicare Other | Source: Ambulatory Visit | Attending: General Surgery | Admitting: General Surgery

## 2020-04-29 DIAGNOSIS — Z01812 Encounter for preprocedural laboratory examination: Secondary | ICD-10-CM | POA: Insufficient documentation

## 2020-04-29 DIAGNOSIS — Z20822 Contact with and (suspected) exposure to covid-19: Secondary | ICD-10-CM | POA: Diagnosis not present

## 2020-04-30 ENCOUNTER — Ambulatory Visit
Admission: RE | Admit: 2020-04-30 | Discharge: 2020-04-30 | Disposition: A | Discharge status: Home / Self Care | Payer: Medicare Other | Source: Ambulatory Visit | Attending: General Surgery | Admitting: General Surgery

## 2020-04-30 ENCOUNTER — Other Ambulatory Visit: Payer: Self-pay

## 2020-04-30 DIAGNOSIS — R928 Other abnormal and inconclusive findings on diagnostic imaging of breast: Secondary | ICD-10-CM

## 2020-04-30 LAB — SARS CORONAVIRUS 2 (TAT 6-24 HRS): SARS Coronavirus 2: NEGATIVE

## 2020-04-30 NOTE — H&P (Signed)
Amy Bailey Location: [facility] Patient #: [id number] DOB: 1950/03/14 Married / Language: English / Race: White Female   History of Present Illness  The patient is a 70 year old female who presents with a complaint of Breast problems. Pt is a 70 yo F who is referred for an abnormal right mammogram and complex sclerosing lesion seen on core needle biopsy. She is here to discuss excision. She hasn't had to get a callback from mammograms before, but her sister has had multiple abnormal mammograms and surgical biopsies. None of those have been cancer. This time, the patient was seen to have possible distortion with a small mass on mammogram. Diagnostic imaging confirmed this, but there was no ultrasound correlate.   She is a G1P1 with menarche at age 7 and menopause in late 37s. She has a history of pancreatic cancer in her mother, cervical cancer in her sister, and some other type of cancer in her brother.    Imaging was done at [facility] through [facility] radiology. reports in allscript.   pathology 02/19/2020 Breast, right, needle core biopsy, right breast upper outer - COMPLEX SCLEROSING LESION WITH CALCIFICATIONS.     Past Surgical History  Appendectomy  Breast Biopsy  Right. Hysterectomy (not due to cancer) - Complete  Shoulder Surgery  Right. Spinal Surgery - Neck   Diagnostic Studies History  Colonoscopy  5-10 years ago Mammogram  within last year  Allergies  No Known Drug Allergies  Allergies Reconciled   Medication History Stool Softener (50MG /5ML Liquid, Oral) Active. MiraLax (17GM Packet, Oral) Active. Medications Reconciled  Social History  Caffeine use  Coffee. No alcohol use  No drug use  Tobacco use  Never smoker.  Family History Cancer  Brother. Cervical Cancer  Sister. Hypertension  Father. Malignant Neoplasm Of Pancreas  Mother.  Pregnancy / Birth History  Age at menarche  1 years. Age of menopause   1-50 Gravida  1 Maternal age  33-25 Para  1 Regular periods   Other Problems  Diverticulosis  Kidney Stone  Migraine Headache     Review of Systems  General Not Present- Appetite Loss, Chills, Fatigue, Fever, Night Sweats, Weight Gain and Weight Loss. Skin Not Present- Change in Wart/Mole, Dryness, Hives, Jaundice, New Lesions, Non-Healing Wounds, Rash and Ulcer. HEENT Present- Wears glasses/contact lenses. Not Present- Earache, Hearing Loss, Hoarseness, Nose Bleed, Oral Ulcers, Ringing in the Ears, Seasonal Allergies, Sinus Pain, Sore Throat, Visual Disturbances and Yellow Eyes. Respiratory Not Present- Bloody sputum, Chronic Cough, Difficulty Breathing, Snoring and Wheezing. Breast Not Present- Breast Mass, Breast Pain, Nipple Discharge and Skin Changes. Cardiovascular Not Present- Chest Pain, Difficulty Breathing Lying Down, Leg Cramps, Palpitations, Rapid Heart Rate, Shortness of Breath and Swelling of Extremities. Gastrointestinal Present- Constipation. Not Present- Abdominal Pain, Bloating, Bloody Stool, Change in Bowel Habits, Chronic diarrhea, Difficulty Swallowing, Excessive gas, Gets full quickly at meals, Hemorrhoids, Indigestion, Nausea, Rectal Pain and Vomiting. Female Genitourinary Not Present- Frequency, Nocturia, Painful Urination, Pelvic Pain and Urgency. Musculoskeletal Not Present- Back Pain, Joint Pain, Joint Stiffness, Muscle Pain, Muscle Weakness and Swelling of Extremities. Neurological Not Present- Decreased Memory, Fainting, Headaches, Numbness, Seizures, Tingling, Tremor, Trouble walking and Weakness. Psychiatric Not Present- Anxiety, Bipolar, Change in Sleep Pattern, Depression, Fearful and Frequent crying. Endocrine Not Present- Cold Intolerance, Excessive Hunger, Hair Changes, Heat Intolerance, Hot flashes and New Diabetes. Hematology Not Present- Blood Thinners, Easy Bruising, Excessive bleeding, Gland problems, HIV and Persistent  Infections.  Vitals  Weight: 116.38 lb Height: 62in Body Surface  Area: 1.52 m Body Mass Index: 21.29 kg/m  Temp.: 97.40F  Pulse: 81 (Regular)  P.OX: 99% (Room air) BP: 110/68(Sitting, Left Arm, Standard)       Physical Exam  General Mental Status-Alert. General Appearance-Consistent with stated age. Hydration-Well hydrated. Voice-Normal.  Head and Neck Head-normocephalic, atraumatic with no lesions or palpable masses. Trachea-midline. Thyroid Gland Characteristics - normal size and consistency.  Eye Eyeball - Bilateral-Extraocular movements intact. Sclera/Conjunctiva - Bilateral-No scleral icterus.  Chest and Lung Exam Chest and lung exam reveals -quiet, even and easy respiratory effort with no use of accessory muscles and on auscultation, normal breath sounds, no adventitious sounds and normal vocal resonance. Inspection Chest Wall - Normal. Back - normal.  Breast Note: relatively symmetric breasts. mild ptosis. Very dense bilaterally. no palpable masses. no nipple retraction or skin dimpling. no LAD.   Cardiovascular Cardiovascular examination reveals -normal heart sounds, regular rate and rhythm with no murmurs and normal pedal pulses bilaterally.  Abdomen Inspection Inspection of the abdomen reveals - No Hernias. Palpation/Percussion Palpation and Percussion of the abdomen reveal - Soft, Non Tender, No Rebound tenderness, No Rigidity (guarding) and No hepatosplenomegaly. Auscultation Auscultation of the abdomen reveals - Bowel sounds normal.  Neurologic Neurologic evaluation reveals -alert and oriented x 3 with no impairment of recent or remote memory. Mental Status-Normal.  Musculoskeletal Global Assessment -Note: no gross deformities.  Normal Exam - Left-Upper Extremity Strength Normal and Lower Extremity Strength Normal. Normal Exam - Right-Upper Extremity Strength Normal and Lower Extremity Strength  Normal.  Lymphatic Head & Neck  General Head & Neck Lymphatics: Bilateral - Description - Normal. Axillary  General Axillary Region: Bilateral - Description - Normal. Tenderness - Non Tender. Femoral & Inguinal  Generalized Femoral & Inguinal Lymphatics: Bilateral - Description - No Generalized lymphadenopathy.    Assessment & Plan  ABNORMAL MAMMOGRAM OF RIGHT BREAST (R92.8) Impression: Pt has a CSL on core needle biopsy and distortion on mammogram.  Discussed seed localized excision and rationale. Pt is reluctant and will discuss with her husband. I reviewed the process of surgery and expectations as well as risks.  The surgical procedure was described to the patient. I discussed the incision type and location and that we would need radiology involved on with a wire or seed marker and/or sentinel node.  The risks and benefits of the procedure were described to the patient and she wishes to proceed.  We discussed the risks bleeding, infection, damage to other structures, need for further procedures/surgeries. We discussed the risk of seroma. The patient was advised if the area in the breast in cancer, we may need to go back to surgery for additional tissue to obtain negative margins or for a lymph node biopsy. The patient was advised that these are the most common complications, but that others can occur as well. They were advised against taking aspirin or other anti-inflammatory agents/blood thinners the week before surgery. Current Plans Pt Education - [facility] Breast Biopsy HCI: discussed with patient and provided information. You are being scheduled for surgery- Our schedulers will call you.  You should hear from our office's scheduling department within 5 working days about the location, date, and time of surgery. We try to make accommodations for patient's preferences in scheduling surgery, but sometimes the OR schedule or the surgeon's schedule prevents [place] from making those  accommodations.  If you have not heard from our office [phone]) in 5 working days, call the office and ask for your surgeon's nurse.  If you have other  questions about your diagnosis, plan, or surgery, call the office and ask for your surgeon's nurse.

## 2020-05-01 ENCOUNTER — Encounter (HOSPITAL_BASED_OUTPATIENT_CLINIC_OR_DEPARTMENT_OTHER)
Admission: RE | Disposition: A | Discharge status: Home / Self Care | Payer: Self-pay | Source: Ambulatory Visit | Attending: General Surgery

## 2020-05-01 ENCOUNTER — Ambulatory Visit (HOSPITAL_BASED_OUTPATIENT_CLINIC_OR_DEPARTMENT_OTHER): Payer: Medicare Other | Admitting: Anesthesiology

## 2020-05-01 ENCOUNTER — Other Ambulatory Visit: Payer: Self-pay

## 2020-05-01 ENCOUNTER — Encounter (HOSPITAL_BASED_OUTPATIENT_CLINIC_OR_DEPARTMENT_OTHER): Payer: Self-pay | Admitting: General Surgery

## 2020-05-01 ENCOUNTER — Ambulatory Visit
Admission: RE | Admit: 2020-05-01 | Discharge: 2020-05-01 | Disposition: A | Discharge status: Home / Self Care | Payer: Medicare Other | Source: Ambulatory Visit | Attending: General Surgery | Admitting: General Surgery

## 2020-05-01 ENCOUNTER — Ambulatory Visit (HOSPITAL_BASED_OUTPATIENT_CLINIC_OR_DEPARTMENT_OTHER)
Admission: RE | Admit: 2020-05-01 | Discharge: 2020-05-01 | Disposition: A | Discharge status: Home / Self Care | Payer: Medicare Other | Source: Ambulatory Visit | Attending: General Surgery | Admitting: General Surgery

## 2020-05-01 DIAGNOSIS — Z800 Family history of malignant neoplasm of digestive organs: Secondary | ICD-10-CM | POA: Insufficient documentation

## 2020-05-01 DIAGNOSIS — N6489 Other specified disorders of breast: Secondary | ICD-10-CM | POA: Insufficient documentation

## 2020-05-01 DIAGNOSIS — R928 Other abnormal and inconclusive findings on diagnostic imaging of breast: Secondary | ICD-10-CM

## 2020-05-01 DIAGNOSIS — Z8049 Family history of malignant neoplasm of other genital organs: Secondary | ICD-10-CM | POA: Diagnosis not present

## 2020-05-01 DIAGNOSIS — E7800 Pure hypercholesterolemia, unspecified: Secondary | ICD-10-CM | POA: Diagnosis not present

## 2020-05-01 DIAGNOSIS — N6011 Diffuse cystic mastopathy of right breast: Secondary | ICD-10-CM | POA: Diagnosis not present

## 2020-05-01 HISTORY — PX: RADIOACTIVE SEED GUIDED EXCISIONAL BREAST BIOPSY: SHX6490

## 2020-05-01 SURGERY — RADIOACTIVE SEED GUIDED BREAST BIOPSY
Anesthesia: General | Site: Breast | Laterality: Right

## 2020-05-01 MED ORDER — FENTANYL CITRATE (PF) 100 MCG/2ML IJ SOLN: 25.0000 ug | INTRAMUSCULAR | Status: DC | PRN

## 2020-05-01 MED ORDER — DEXAMETHASONE SODIUM PHOSPHATE 4 MG/ML IJ SOLN
INTRAMUSCULAR | Status: DC | PRN
  Administered 2020-05-01: 5 mg via INTRAVENOUS

## 2020-05-01 MED ORDER — AMISULPRIDE (ANTIEMETIC) 5 MG/2ML IV SOLN: 10.0000 mg | Freq: Once | INTRAVENOUS | Status: DC | PRN

## 2020-05-01 MED ORDER — MIDAZOLAM HCL 5 MG/5ML IJ SOLN
INTRAMUSCULAR | Status: DC | PRN
  Administered 2020-05-01: 1 mg via INTRAVENOUS

## 2020-05-01 MED ORDER — FENTANYL CITRATE (PF) 100 MCG/2ML IJ SOLN
INTRAMUSCULAR | Status: AC
  Filled 2020-05-01: qty 2

## 2020-05-01 MED ORDER — PROPOFOL 10 MG/ML IV BOLUS
INTRAVENOUS | Status: DC | PRN
  Administered 2020-05-01: 100 mg via INTRAVENOUS

## 2020-05-01 MED ORDER — FENTANYL CITRATE (PF) 100 MCG/2ML IJ SOLN
INTRAMUSCULAR | Status: DC | PRN
  Administered 2020-05-01: 50 ug via INTRAVENOUS

## 2020-05-01 MED ORDER — LIDOCAINE HCL 1 % IJ SOLN
INTRAMUSCULAR | Status: DC | PRN
  Administered 2020-05-01: 20 mL

## 2020-05-01 MED ORDER — BUPIVACAINE-EPINEPHRINE (PF) 0.25% -1:200000 IJ SOLN
INTRAMUSCULAR | Status: AC
  Filled 2020-05-01: qty 150

## 2020-05-01 MED ORDER — EPHEDRINE SULFATE 50 MG/ML IJ SOLN
INTRAMUSCULAR | Status: DC | PRN
  Administered 2020-05-01: 10 mg via INTRAVENOUS

## 2020-05-01 MED ORDER — CHLORHEXIDINE GLUCONATE CLOTH 2 % EX PADS: 6.0000 | MEDICATED_PAD | Freq: Once | CUTANEOUS | Status: DC

## 2020-05-01 MED ORDER — ACETAMINOPHEN 500 MG PO TABS
1000.0000 mg | ORAL_TABLET | ORAL | Status: AC
  Administered 2020-05-01: 1000 mg via ORAL

## 2020-05-01 MED ORDER — ACETAMINOPHEN 500 MG PO TABS
ORAL_TABLET | ORAL | Status: AC
  Filled 2020-05-01: qty 2

## 2020-05-01 MED ORDER — LIDOCAINE 2% (20 MG/ML) 5 ML SYRINGE
INTRAMUSCULAR | Status: AC
  Filled 2020-05-01: qty 5

## 2020-05-01 MED ORDER — DEXAMETHASONE SODIUM PHOSPHATE 10 MG/ML IJ SOLN
INTRAMUSCULAR | Status: AC
  Filled 2020-05-01: qty 1

## 2020-05-01 MED ORDER — CEFAZOLIN SODIUM-DEXTROSE 2-4 GM/100ML-% IV SOLN
INTRAVENOUS | Status: AC
  Filled 2020-05-01: qty 100

## 2020-05-01 MED ORDER — SODIUM CHLORIDE (PF) 0.9 % IJ SOLN
INTRAMUSCULAR | Status: AC
  Filled 2020-05-01: qty 10

## 2020-05-01 MED ORDER — MIDAZOLAM HCL 2 MG/2ML IJ SOLN
INTRAMUSCULAR | Status: AC
  Filled 2020-05-01: qty 2

## 2020-05-01 MED ORDER — LIDOCAINE HCL (CARDIAC) PF 100 MG/5ML IV SOSY
PREFILLED_SYRINGE | INTRAVENOUS | Status: DC | PRN
  Administered 2020-05-01: 40 mg via INTRAVENOUS

## 2020-05-01 MED ORDER — BUPIVACAINE HCL (PF) 0.25 % IJ SOLN
INTRAMUSCULAR | Status: AC
  Filled 2020-05-01: qty 150

## 2020-05-01 MED ORDER — SUCCINYLCHOLINE CHLORIDE 200 MG/10ML IV SOSY
PREFILLED_SYRINGE | INTRAVENOUS | Status: AC
  Filled 2020-05-01: qty 10

## 2020-05-01 MED ORDER — OXYCODONE HCL 5 MG/5ML PO SOLN: 5.0000 mg | Freq: Once | ORAL | Status: DC | PRN

## 2020-05-01 MED ORDER — PROPOFOL 500 MG/50ML IV EMUL
INTRAVENOUS | Status: AC
  Filled 2020-05-01: qty 50

## 2020-05-01 MED ORDER — LACTATED RINGERS IV SOLN: INTRAVENOUS | Status: DC

## 2020-05-01 MED ORDER — PHENYLEPHRINE 40 MCG/ML (10ML) SYRINGE FOR IV PUSH (FOR BLOOD PRESSURE SUPPORT)
PREFILLED_SYRINGE | INTRAVENOUS | Status: AC
  Filled 2020-05-01: qty 10

## 2020-05-01 MED ORDER — OXYCODONE HCL 5 MG PO TABS: 5.0000 mg | ORAL_TABLET | Freq: Once | ORAL | Status: DC | PRN

## 2020-05-01 MED ORDER — ONDANSETRON HCL 4 MG/2ML IJ SOLN
INTRAMUSCULAR | Status: AC
  Filled 2020-05-01: qty 2

## 2020-05-01 MED ORDER — EPHEDRINE 5 MG/ML INJ
INTRAVENOUS | Status: AC
  Filled 2020-05-01: qty 10

## 2020-05-01 MED ORDER — LIDOCAINE HCL (PF) 1 % IJ SOLN
INTRAMUSCULAR | Status: AC
  Filled 2020-05-01: qty 150

## 2020-05-01 MED ORDER — CEFAZOLIN SODIUM-DEXTROSE 2-4 GM/100ML-% IV SOLN
2.0000 g | INTRAVENOUS | Status: AC
  Administered 2020-05-01: 2 g via INTRAVENOUS

## 2020-05-01 MED ORDER — METHYLENE BLUE 0.5 % INJ SOLN
INTRAVENOUS | Status: AC
  Filled 2020-05-01: qty 10

## 2020-05-01 MED ORDER — TRAMADOL HCL 50 MG PO TABS: 50.0000 mg | ORAL_TABLET | Freq: Four times a day (QID) | ORAL | 0 refills | Status: DC | PRN

## 2020-05-01 SURGICAL SUPPLY — 52 items
ADH SKN CLS APL DERMABOND .7 (GAUZE/BANDAGES/DRESSINGS) ×1
APL PRP STRL LF DISP 70% ISPRP (MISCELLANEOUS) ×1
BINDER BREAST MEDIUM (GAUZE/BANDAGES/DRESSINGS) ×1 IMPLANT
BLADE SURG 10 STRL SS (BLADE) ×2 IMPLANT
BLADE SURG 15 STRL LF DISP TIS (BLADE) IMPLANT
BLADE SURG 15 STRL SS (BLADE)
CANISTER SUC SOCK COL 7IN (MISCELLANEOUS) IMPLANT
CANISTER SUCT 1200ML W/VALVE (MISCELLANEOUS) ×2 IMPLANT
CHLORAPREP W/TINT 26 (MISCELLANEOUS) ×2 IMPLANT
CLIP VESOCCLUDE LG 6/CT (CLIP) ×2 IMPLANT
COVER BACK TABLE 60X90IN (DRAPES) ×2 IMPLANT
COVER MAYO STAND STRL (DRAPES) ×2 IMPLANT
COVER PROBE W GEL 5X96 (DRAPES) ×2 IMPLANT
COVER WAND RF STERILE (DRAPES) IMPLANT
DECANTER SPIKE VIAL GLASS SM (MISCELLANEOUS) IMPLANT
DERMABOND ADVANCED (GAUZE/BANDAGES/DRESSINGS) ×1
DERMABOND ADVANCED .7 DNX12 (GAUZE/BANDAGES/DRESSINGS) ×1 IMPLANT
DRAPE LAPAROSCOPIC ABDOMINAL (DRAPES) ×2 IMPLANT
DRAPE UTILITY XL STRL (DRAPES) ×2 IMPLANT
ELECT COATED BLADE 2.86 ST (ELECTRODE) ×2 IMPLANT
ELECT REM PT RETURN 9FT ADLT (ELECTROSURGICAL) ×2
ELECTRODE REM PT RTRN 9FT ADLT (ELECTROSURGICAL) ×1 IMPLANT
GAUZE SPONGE 4X4 12PLY STRL LF (GAUZE/BANDAGES/DRESSINGS) ×2 IMPLANT
GLOVE SURG ENC MOIS LTX SZ6 (GLOVE) ×2 IMPLANT
GLOVE SURG UNDER POLY LF SZ6.5 (GLOVE) ×2 IMPLANT
GLOVE SURG UNDER POLY LF SZ7 (GLOVE) ×1 IMPLANT
GOWN STRL REUS W/ TWL LRG LVL3 (GOWN DISPOSABLE) ×1 IMPLANT
GOWN STRL REUS W/TWL 2XL LVL3 (GOWN DISPOSABLE) ×2 IMPLANT
GOWN STRL REUS W/TWL LRG LVL3 (GOWN DISPOSABLE) ×4
KIT MARKER MARGIN INK (KITS) ×2 IMPLANT
LIGHT WAVEGUIDE WIDE FLAT (MISCELLANEOUS) IMPLANT
NDL HYPO 25X1 1.5 SAFETY (NEEDLE) ×1 IMPLANT
NEEDLE HYPO 25X1 1.5 SAFETY (NEEDLE) ×2 IMPLANT
NS IRRIG 1000ML POUR BTL (IV SOLUTION) ×2 IMPLANT
PACK BASIN DAY SURGERY FS (CUSTOM PROCEDURE TRAY) ×2 IMPLANT
PENCIL SMOKE EVACUATOR (MISCELLANEOUS) ×2 IMPLANT
SLEEVE SCD COMPRESS KNEE MED (STOCKING) ×2 IMPLANT
SPONGE LAP 18X18 RF (DISPOSABLE) ×2 IMPLANT
STAPLER VISISTAT 35W (STAPLE) IMPLANT
STRIP CLOSURE SKIN 1/2X4 (GAUZE/BANDAGES/DRESSINGS) ×2 IMPLANT
SUT MON AB 4-0 PC3 18 (SUTURE) ×3 IMPLANT
SUT SILK 2 0 SH (SUTURE) IMPLANT
SUT VIC AB 2-0 SH 18 (SUTURE) IMPLANT
SUT VIC AB 3-0 SH 27 (SUTURE) ×4
SUT VIC AB 3-0 SH 27X BRD (SUTURE) ×1 IMPLANT
SUT VICRYL 3-0 CR8 SH (SUTURE) IMPLANT
SYR BULB EAR ULCER 3OZ GRN STR (SYRINGE) ×2 IMPLANT
SYR CONTROL 10ML LL (SYRINGE) ×2 IMPLANT
TOWEL GREEN STERILE FF (TOWEL DISPOSABLE) ×2 IMPLANT
TRAY FAXITRON CT DISP (TRAY / TRAY PROCEDURE) ×2 IMPLANT
TUBE CONNECTING 20X1/4 (TUBING) ×2 IMPLANT
YANKAUER SUCT BULB TIP NO VENT (SUCTIONS) ×2 IMPLANT

## 2020-05-01 NOTE — Anesthesia Preprocedure Evaluation (Addendum)
Anesthesia Evaluation  Patient identified by MRN, date of birth, ID band Patient awake    Reviewed: Allergy & Precautions, NPO status , Patient's Chart, lab work & pertinent test results  Airway Mallampati: II  TM Distance: >3 FB Neck ROM: Full    Dental  (+) Missing, Edentulous Upper   Pulmonary neg pulmonary ROS,    Pulmonary exam normal breath sounds clear to auscultation       Cardiovascular negative cardio ROS Normal cardiovascular exam Rhythm:Regular Rate:Normal     Neuro/Psych negative neurological ROS     GI/Hepatic negative GI ROS, Neg liver ROS,   Endo/Other  negative endocrine ROS  Renal/GU negative Renal ROS     Musculoskeletal negative musculoskeletal ROS (+)   Abdominal   Peds  Hematology negative hematology ROS (+)   Anesthesia Other Findings   Reproductive/Obstetrics                            Anesthesia Physical Anesthesia Plan  ASA: II  Anesthesia Plan: General   Post-op Pain Management:    Induction: Intravenous  PONV Risk Score and Plan: 4 or greater and Ondansetron, Dexamethasone, Treatment may vary due to age or medical condition and Diphenhydramine  Airway Management Planned: LMA  Additional Equipment: None  Intra-op Plan:   Post-operative Plan: Extubation in OR  Informed Consent: I have reviewed the patients History and Physical, chart, labs and discussed the procedure including the risks, benefits and alternatives for the proposed anesthesia with the patient or authorized representative who has indicated his/her understanding and acceptance.     Dental advisory given  Plan Discussed with: CRNA  Anesthesia Plan Comments:        Anesthesia Quick Evaluation

## 2020-05-01 NOTE — Op Note (Signed)
Right Breast Radioactive seed localized excisional biopsy  Indications: This patient presents with history of abnormal right mammogram with discordant core needle biopsy.    Pre-operative Diagnosis: abnormal right mammogram    Post-operative Diagnosis: abnormal right mammogram  Surgeon: Stark Klein   Anesthesia: General endotracheal anesthesia  ASA Class: 2  Procedure Details  The patient was seen in the Holding Room. The risks, benefits, complications, treatment options, and expected outcomes were discussed with the patient. The possibilities of bleeding, infection, the need for additional procedures, failure to diagnose a condition, and creating a complication requiring transfusion or operation were discussed with the patient. The patient concurred with the proposed plan, giving informed consent.  The site of surgery properly noted/marked. The patient was taken to Operating Room # 8, identified, and the procedure verified as right Breast seed localized excisional biopsy. A Time Out was held and the above information confirmed.  The right breast and chest were prepped and draped in standard fashion. A lateral incision was made near the previously placed radioactive seed.  Dissection was carried down around the point of maximum signal intensity. The cautery was used to perform the dissection.   The specimen was inked with the margin marker paint kit.    Specimen radiography confirmed inclusion of the mammographic lesion, the clip, and the seed.  The background signal in the breast was zero.  Hemostasis was achieved with cautery.  The wound was irrigated and closed with 2-0 vicryl deep interrupted sutures, 3-0 vicryl interrupted deep dermal sutures and 4-0 monocryl running subcuticular suture.      Sterile dressings were applied. At the end of the operation, all sponge, instrument, and needle counts were correct.  Findings: Seed, clip in specimen.  anterior margin is skin.  Estimated Blood  Loss:  min         Specimens: right breast tissue with seed         Complications:  None; patient tolerated the procedure well.         Disposition: PACU - hemodynamically stable.         Condition: stable

## 2020-05-01 NOTE — Discharge Instructions (Addendum)
[facility] Office Phone Number [phone]  BREAST BIOPSY/ PARTIAL MASTECTOMY: POST OP INSTRUCTIONS  Always review your discharge instruction sheet given to you by the facility where your surgery was performed.  IF YOU HAVE DISABILITY OR FAMILY LEAVE FORMS, YOU MUST BRING THEM TO THE OFFICE FOR PROCESSING.  DO NOT GIVE THEM TO YOUR DOCTOR.  1. A prescription for pain medication may be given to you upon discharge.  Take your pain medication as prescribed, if needed.  If narcotic pain medicine is not needed, then you may take acetaminophen (Tylenol) or ibuprofen (Advil) as needed. *No Tylenol until 1:45 pm day of surgery 2. Take your usually prescribed medications unless otherwise directed 3. If you need a refill on your pain medication, please contact your pharmacy.  They will contact our office to request authorization.  Prescriptions will not be filled after 5pm or on week-ends. 4. You should eat very light the first 24 hours after surgery, such as soup, crackers, pudding, etc.  Resume your normal diet the day after surgery. 5. Most patients will experience some swelling and bruising in the breast.  Ice packs and a good support bra will help.  Swelling and bruising can take several days to resolve.  6. It is common to experience some constipation if taking pain medication after surgery.  Increasing fluid intake and taking a stool softener will usually help or prevent this problem from occurring.  A mild laxative (Milk of Magnesia or Miralax) should be taken according to package directions if there are no bowel movements after 48 hours. 7. Unless discharge instructions indicate otherwise, you may remove your bandages 48 hours after surgery, and you may shower at that time.  You may have steri-strips (small skin tapes) in place directly over the incision.  These strips should be left on the skin for 7-10 days.   Any sutures or staples will be removed at the office during your  follow-up visit. 8. ACTIVITIES:  You may resume regular daily activities (gradually increasing) beginning the next day.  Wearing a good support bra or sports bra (or the breast binder) minimizes pain and swelling.  You may have sexual intercourse when it is comfortable. a. You may drive when you no longer are taking prescription pain medication, you can comfortably wear a seatbelt, and you can safely maneuver your car and apply brakes. b. RETURN TO WORK:  __________1 week_______________ 9. You should see your doctor in the office for a follow-up appointment approximately two weeks after your surgery.  Your doctor's nurse will typically make your follow-up appointment when she calls you with your pathology report.  Expect your pathology report 2-3 business days after your surgery.  You may call to check if you do not hear from [place] after three days.   WHEN TO CALL YOUR DOCTOR: 1. Fever over 101.0 2. Nausea and/or vomiting. 3. Extreme swelling or bruising. 4. Continued bleeding from incision. 5. Increased pain, redness, or drainage from the incision.  The clinic staff is available to answer your questions during regular business hours.  Please don't hesitate to call and ask to speak to one of the nurses for clinical concerns.  If you have a medical emergency, go to the nearest emergency room or call 911.  A surgeon from [facility] is always on call at the hospital.  For further questions, please visit [email or web address]    Post Anesthesia Home Care Instructions  Activity: Get plenty of rest for the remainder of the day. A  responsible individual must stay with you for 24 hours following the procedure.  For the next 24 hours, DO NOT: -Drive a car -Paediatric nurse -Drink alcoholic beverages -Take any medication unless instructed by your physician -Make any legal decisions or sign important papers.  Meals: Start with liquid foods such as gelatin or soup. Progress to  regular foods as tolerated. Avoid greasy, spicy, heavy foods. If nausea and/or vomiting occur, drink only clear liquids until the nausea and/or vomiting subsides. Call your physician if vomiting continues.  Special Instructions/Symptoms: Your throat may feel dry or sore from the anesthesia or the breathing tube placed in your throat during surgery. If this causes discomfort, gargle with warm salt water. The discomfort should disappear within 24 hours.  If you had a scopolamine patch placed behind your ear for the management of post- operative nausea and/or vomiting:  1. The medication in the patch is effective for 72 hours, after which it should be removed.  Wrap patch in a tissue and discard in the trash. Wash hands thoroughly with soap and water. 2. You may remove the patch earlier than 72 hours if you experience unpleasant side effects which may include dry mouth, dizziness or visual disturbances. 3. Avoid touching the patch. Wash your hands with soap and water after contact with the patch.

## 2020-05-01 NOTE — Interval H&P Note (Signed)
History and Physical Interval Note:  05/01/2020 9:09 AM  Amy Bailey  has presented today for surgery, with the diagnosis of ABNORMAL RIGHT MAMMOGRAM.  The various methods of treatment have been discussed with the patient and family. After consideration of risks, benefits and other options for treatment, the patient has consented to  Procedure(s): RIGHT BREAST SEED LOCALIZED EXCISIONAL BIOPSY (Right) as a surgical intervention.  The patient's history has been reviewed, patient examined, no change in status, stable for surgery.  I have reviewed the patient's chart and labs.  Questions were answered to the patient's satisfaction.     Stark Klein

## 2020-05-01 NOTE — Transfer of Care (Signed)
Immediate Anesthesia Transfer of Care Note  Patient: LAVERGNE HILTUNEN  Procedure(s) Performed: RIGHT BREAST SEED LOCALIZED EXCISIONAL BIOPSY (Right Breast)  Patient Location: PACU  Anesthesia Type:General  Level of Consciousness: sedated  Airway & Oxygen Therapy: Patient Spontanous Breathing and Patient connected to face mask oxygen  Post-op Assessment: Report given to RN and Post -op Vital signs reviewed and stable  Post vital signs: Reviewed and stable  Last Vitals:  Vitals Value Taken Time  BP    Temp    Pulse    Resp    SpO2      Last Pain:  Vitals:   05/01/20 0742  TempSrc: Oral  PainSc: 0-No pain         Complications: No complications documented.

## 2020-05-01 NOTE — Anesthesia Procedure Notes (Signed)
Procedure Name: LMA Insertion Date/Time: 05/01/2020 9:22 AM Performed by: Willa Frater, CRNA Pre-anesthesia Checklist: Patient identified, Emergency Drugs available, Suction available and Patient being monitored Patient Re-evaluated:Patient Re-evaluated prior to induction Oxygen Delivery Method: Circle system utilized Preoxygenation: Pre-oxygenation with 100% oxygen Induction Type: IV induction Ventilation: Mask ventilation without difficulty LMA: LMA inserted LMA Size: 3.0 Number of attempts: 1 Airway Equipment and Method: Bite block Placement Confirmation: positive ETCO2 Tube secured with: Tape Dental Injury: Teeth and Oropharynx as per pre-operative assessment

## 2020-05-01 NOTE — Anesthesia Postprocedure Evaluation (Signed)
Anesthesia Post Note  Patient: Amy Bailey  Procedure(s) Performed: RIGHT BREAST SEED LOCALIZED EXCISIONAL BIOPSY (Right Breast)     Patient location during evaluation: PACU Anesthesia Type: General Level of consciousness: sedated and patient cooperative Pain management: pain level controlled Vital Signs Assessment: post-procedure vital signs reviewed and stable Respiratory status: spontaneous breathing Cardiovascular status: stable Anesthetic complications: no   No complications documented.  Last Vitals:  Vitals:   05/01/20 1050 05/01/20 1100  BP:  (!) 134/49  Pulse: (!) 59 (!) 59  Resp: 12 16  Temp:  37 C  SpO2: 100% 100%    Last Pain:  Vitals:   05/01/20 1100  TempSrc:   PainSc: 0-No pain                 Nolon Nations

## 2020-05-02 ENCOUNTER — Encounter (HOSPITAL_BASED_OUTPATIENT_CLINIC_OR_DEPARTMENT_OTHER): Payer: Self-pay | Admitting: General Surgery

## 2020-05-03 LAB — SURGICAL PATHOLOGY

## 2020-06-03 DIAGNOSIS — Z6821 Body mass index (BMI) 21.0-21.9, adult: Secondary | ICD-10-CM | POA: Diagnosis not present

## 2020-06-03 DIAGNOSIS — E782 Mixed hyperlipidemia: Secondary | ICD-10-CM | POA: Diagnosis not present

## 2020-06-03 DIAGNOSIS — M79662 Pain in left lower leg: Secondary | ICD-10-CM | POA: Diagnosis not present

## 2020-07-10 DIAGNOSIS — H52222 Regular astigmatism, left eye: Secondary | ICD-10-CM | POA: Diagnosis not present

## 2020-08-20 DIAGNOSIS — M5459 Other low back pain: Secondary | ICD-10-CM | POA: Diagnosis not present

## 2020-08-20 DIAGNOSIS — M79662 Pain in left lower leg: Secondary | ICD-10-CM | POA: Diagnosis not present

## 2020-09-06 DIAGNOSIS — M5459 Other low back pain: Secondary | ICD-10-CM | POA: Diagnosis not present

## 2020-09-13 DIAGNOSIS — M5459 Other low back pain: Secondary | ICD-10-CM | POA: Diagnosis not present

## 2020-10-21 DIAGNOSIS — Z23 Encounter for immunization: Secondary | ICD-10-CM | POA: Diagnosis not present

## 2020-10-29 DIAGNOSIS — R202 Paresthesia of skin: Secondary | ICD-10-CM | POA: Diagnosis not present

## 2020-10-29 DIAGNOSIS — R29818 Other symptoms and signs involving the nervous system: Secondary | ICD-10-CM | POA: Diagnosis not present

## 2020-10-29 DIAGNOSIS — R030 Elevated blood-pressure reading, without diagnosis of hypertension: Secondary | ICD-10-CM | POA: Diagnosis not present

## 2020-10-29 DIAGNOSIS — R002 Palpitations: Secondary | ICD-10-CM | POA: Diagnosis not present

## 2020-10-30 ENCOUNTER — Other Ambulatory Visit (HOSPITAL_COMMUNITY): Payer: Self-pay | Admitting: Registered Nurse

## 2020-10-30 ENCOUNTER — Ambulatory Visit (HOSPITAL_COMMUNITY)
Admission: RE | Admit: 2020-10-30 | Discharge: 2020-10-30 | Disposition: A | Discharge status: Home / Self Care | Payer: Medicare Other | Source: Ambulatory Visit | Attending: Registered Nurse | Admitting: Registered Nurse

## 2020-10-30 DIAGNOSIS — R200 Anesthesia of skin: Secondary | ICD-10-CM | POA: Diagnosis not present

## 2020-10-30 DIAGNOSIS — R202 Paresthesia of skin: Secondary | ICD-10-CM | POA: Insufficient documentation

## 2020-10-30 DIAGNOSIS — R29818 Other symptoms and signs involving the nervous system: Secondary | ICD-10-CM | POA: Insufficient documentation

## 2020-11-06 DIAGNOSIS — F411 Generalized anxiety disorder: Secondary | ICD-10-CM | POA: Diagnosis not present

## 2020-11-06 DIAGNOSIS — R002 Palpitations: Secondary | ICD-10-CM | POA: Diagnosis not present

## 2020-11-06 DIAGNOSIS — I700 Atherosclerosis of aorta: Secondary | ICD-10-CM | POA: Diagnosis not present

## 2020-11-06 DIAGNOSIS — F430 Acute stress reaction: Secondary | ICD-10-CM | POA: Diagnosis not present

## 2020-11-11 DIAGNOSIS — R002 Palpitations: Secondary | ICD-10-CM | POA: Diagnosis not present

## 2020-11-15 ENCOUNTER — Ambulatory Visit: Payer: Medicare Other | Admitting: Cardiovascular Disease

## 2020-11-15 ENCOUNTER — Encounter: Payer: Self-pay | Admitting: Cardiovascular Disease

## 2020-11-15 ENCOUNTER — Other Ambulatory Visit: Payer: Self-pay

## 2020-11-15 DIAGNOSIS — R002 Palpitations: Secondary | ICD-10-CM | POA: Diagnosis not present

## 2020-11-15 MED ORDER — ATENOLOL 25 MG PO TABS: 12.5000 mg | ORAL_TABLET | Freq: Every day | ORAL | 1 refills | Status: DC

## 2020-11-15 NOTE — Assessment & Plan Note (Signed)
Amy Bailey was referred by Dr. Venetia Maxon for evaluation of palpitations.  These are new onset over the last month.  They occur fairly frequently lasting for 5 minutes at a time.  There is no associated chest pain or shortness of breath.  She has been under some stress because her husband [name], my patient, has been hospitalized recently although her symptoms began prior to this.  She apparently had an event monitor performed by Dr. Venetia Maxon the results of which are currently unavailable to me.  I am going to get TSH and free T4, 2D echo, coronary calcium score and we will see her back in 6 to 8 weeks for follow-up.

## 2020-11-15 NOTE — Progress Notes (Signed)
11/15/2020 Amy Bailey   Mar 30, 1950  [id number]  Primary Physician Street, Sharon Mt, MD Primary Cardiologist: Amy Harp MD Amy Bailey, [place]  HPI:  Amy Bailey is a 70 y.o. thin appearing married Caucasian female mother of 4, grandmother of 1 grandchild who is husband Amy Bailey is also a patient of mine.  She was referred by her PCP, Dr. Christa Bailey, for evaluation of tachypalpitations and a recent event monitor that apparently showed atrial tachycardia.  She has no cardiac risk factors.  She is never had a heart attack or stroke.  There is no family history of heart disease except that her brother did die suddenly of unclear causes.  She gets occasional atypical chest pain.  She says that she began to have tachypalpitations a month ago which predated her husband's recent hospitalization.  The palpitations occur fairly frequently, last up to 5 minutes at a time without other associated symptoms.  An event monitor that she wore for 7 days ordered by Dr. Venetia Bailey apparently showed atrial tachycardia although I currently have not been able to assess this personally.   Current Meds  Medication Sig   Docusate Calcium (STOOL SOFTENER PO) Take 2 tablets by mouth daily.   Omega-3 Fatty Acids (FISH OIL PO) Take 2 tablets by mouth daily.   Polyethylene Glycol 3350 (MIRALAX PO) Take by mouth.     No Known Allergies  Social History   Socioeconomic History   Marital status: Married    Spouse name: Not on file   Number of children: Not on file   Years of education: Not on file   Highest education level: Not on file  Occupational History   Not on file  Tobacco Use   Smoking status: Never   Smokeless tobacco: Never  Substance and Sexual Activity   Alcohol use: No   Drug use: No   Sexual activity: Not on file  Other Topics Concern   Not on file  Social History Narrative   She lives with her husband.    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  Cardiovascular: negative for chest pain, dyspnea on exertion, edema, orthopnea, palpitations, paroxysmal nocturnal dyspnea or shortness of breath Dermatological: negative for rash Respiratory: negative for cough or wheezing Urologic: negative for hematuria Abdominal: negative for nausea, vomiting, diarrhea, bright red blood per rectum, melena, or hematemesis Neurologic: negative for visual changes, syncope, or dizziness All other systems reviewed and are otherwise negative except as noted above.    Blood pressure (!) 164/72, pulse 62, height 5\' 2"  (1.575 m), weight 114 lb 9.6 oz (52 kg), SpO2 97 %.  General appearance: alert and no distress Neck: no adenopathy, no carotid bruit, no JVD, supple, symmetrical, trachea midline, and thyroid not enlarged, symmetric, no tenderness/mass/nodules Lungs: clear to auscultation bilaterally Heart: regular rate and rhythm, S1, S2 normal, no murmur, click, rub or gallop Extremities: extremities normal, atraumatic, no cyanosis or edema Pulses: 2+ and symmetric Skin: Skin color, texture, turgor normal. No rashes or lesions Neurologic: Grossly normal  EKG sinus rhythm at 62 without ST or T wave changes.  I personally reviewed this EKG.  ASSESSMENT AND PLAN:   Palpitations Ms. Namba was referred by Dr. Venetia Bailey for evaluation of palpitations.  These are new onset over the last month.  They occur fairly frequently lasting for 5 minutes at a time.  There is no associated chest pain or shortness of breath.  She has been under some stress because her husband Amy Bailey, my  patient, has been hospitalized recently although her symptoms began prior to this.  She apparently had an event monitor performed by Dr. Venetia Bailey the results of which are currently unavailable to me.  I am going to get TSH and free T4, 2D echo, coronary calcium score and we will Bailey her back in 6 to 8 weeks for follow-up.     Amy Harp MD FACP,FACC,FAHA, [facility] 11/15/2020 9:07 AM

## 2020-11-15 NOTE — Patient Instructions (Signed)
Medication Instructions:   -Start atenolol (Tenormin) 12.5mg  once daily.  *If you need a refill on your cardiac medications before your next appointment, please call your pharmacy*   Lab Work: Your physician recommends that you have labs drawn today: TSH, Free T4  If you have labs (blood work) drawn today and your tests are completely normal, you will receive your results only by: [place] (if you have MyChart) OR A paper copy in the mail If you have any lab test that is abnormal or we need to change your treatment, we will call you to review the results.   Testing/Procedures: Your physician has requested that you have an echocardiogram. Echocardiography is a painless test that uses sound waves to create images of your heart. It provides your doctor with information about the size and shape of your heart and how well your heart's chambers and valves are working. This procedure takes approximately one hour. There are no restrictions for this procedure. This procedure is done at [address]  Dr. Gwenlyn Found has ordered a CT coronary calcium score. This test is done at [address]. This is $99 out of pocket.   Coronary CalciumScan A coronary calcium scan is an imaging test used to look for deposits of calcium and other fatty materials (plaques) in the inner lining of the blood vessels of the heart (coronary arteries). These deposits of calcium and plaques can partly clog and narrow the coronary arteries without producing any symptoms or warning signs. This puts a person at risk for a heart attack. This test can detect these deposits before symptoms develop. Tell a health care provider about: Any allergies you have. All medicines you are taking, including vitamins, herbs, eye drops, creams, and over-the-counter medicines. Any problems you or family members have had with anesthetic medicines. Any blood disorders you have. Any surgeries you have had. Any medical  conditions you have. Whether you are pregnant or may be pregnant. What are the risks? Generally, this is a safe procedure. However, problems may occur, including: Harm to a pregnant woman and her unborn baby. This test involves the use of radiation. Radiation exposure can be dangerous to a pregnant woman and her unborn baby. If you are pregnant, you generally should not have this procedure done. Slight increase in the risk of cancer. This is because of the radiation involved in the test. What happens before the procedure? No preparation is needed for this procedure. What happens during the procedure? You will undress and remove any jewelry around your neck or chest. You will put on a hospital gown. Sticky electrodes will be placed on your chest. The electrodes will be connected to an electrocardiogram (ECG) machine to record a tracing of the electrical activity of your heart. A CT scanner will take pictures of your heart. During this time, you will be asked to lie still and hold your breath for 2-3 seconds while a picture of your heart is being taken. The procedure may vary among health care providers and hospitals. What happens after the procedure? You can get dressed. You can return to your normal activities. It is up to you to get the results of your test. Ask your health care provider, or the department that is doing the test, when your results will be ready. Summary A coronary calcium scan is an imaging test used to look for deposits of calcium and other fatty materials (plaques) in the inner lining of the blood vessels of the  heart (coronary arteries). Generally, this is a safe procedure. Tell your health care provider if you are pregnant or may be pregnant. No preparation is needed for this procedure. A CT scanner will take pictures of your heart. You can return to your normal activities after the scan is done. This information is not intended to replace advice given to you by your  health care provider. Make sure you discuss any questions you have with your health care provider. Document Released: 06/27/2007 Document Revised: 11/18/2015 Document Reviewed: 11/18/2015 Elsevier Interactive Patient Education  2017 [place]: At [facility], you and your health needs are our priority.  As part of our continuing mission to provide you with exceptional heart care, we have created designated Provider Care Teams.  These Care Teams include your primary Cardiologist (physician) and Advanced Practice Providers (APPs -  Physician Assistants and Nurse Practitioners) who all work together to provide you with the care you need, when you need it.  We recommend signing up for the patient portal called "MyChart".  Sign up information is provided on this After Visit Summary.  MyChart is used to connect with patients for Virtual Visits (Telemedicine).  Patients are able to view lab/test results, encounter notes, upcoming appointments, etc.  Non-urgent messages can be sent to your provider as well.   To learn more about what you can do with MyChart, go to [email or web address].    Your next appointment:   8 week(s)  The format for your next appointment:   In Person  Provider:   Quay Burow, MD

## 2020-11-16 LAB — TSH+FREE T4
Free T4: 1.14 ng/dL (ref 0.82–1.77)
TSH: 2.38 u[IU]/mL (ref 0.450–4.500)

## 2020-12-10 ENCOUNTER — Ambulatory Visit (HOSPITAL_COMMUNITY): Payer: Medicare Other | Attending: Cardiology

## 2020-12-10 ENCOUNTER — Other Ambulatory Visit: Payer: Self-pay

## 2020-12-10 ENCOUNTER — Other Ambulatory Visit: Payer: Medicare Other

## 2020-12-10 ENCOUNTER — Ambulatory Visit (INDEPENDENT_AMBULATORY_CARE_PROVIDER_SITE_OTHER)
Admission: RE | Admit: 2020-12-10 | Discharge: 2020-12-10 | Disposition: A | Discharge status: Home / Self Care | Payer: Self-pay | Source: Ambulatory Visit | Attending: Cardiovascular Disease | Admitting: Cardiovascular Disease

## 2020-12-10 DIAGNOSIS — R002 Palpitations: Secondary | ICD-10-CM

## 2020-12-10 DIAGNOSIS — I351 Nonrheumatic aortic (valve) insufficiency: Secondary | ICD-10-CM | POA: Diagnosis not present

## 2020-12-10 LAB — ECHOCARDIOGRAM COMPLETE
Area-P 1/2: 3.08 cm2
P 1/2 time: 512 msec
S' Lateral: 2.9 cm

## 2021-01-14 ENCOUNTER — Encounter: Payer: Self-pay | Admitting: Cardiovascular Disease

## 2021-01-14 ENCOUNTER — Other Ambulatory Visit: Payer: Self-pay

## 2021-01-14 ENCOUNTER — Ambulatory Visit: Payer: Medicare Other | Admitting: Cardiovascular Disease

## 2021-01-14 DIAGNOSIS — R002 Palpitations: Secondary | ICD-10-CM | POA: Diagnosis not present

## 2021-01-14 MED ORDER — ATENOLOL 25 MG PO TABS: 25.0000 mg | ORAL_TABLET | Freq: Every day | ORAL | 3 refills | Status: DC

## 2021-01-14 NOTE — Assessment & Plan Note (Signed)
Amy Bailey returns today for follow-up of palpitations.  She apparently had an event monitor performed by her PCP that showed atrial tachycardia although I have not personally reviewed this.  I performed 2D echocardiography on her that showed normal LV systolic function with moderate AI.  A coronary calcium score was 0.  Her palpitations did somewhat improve on atenolol 12.5 mg which we will increase to 25 mg a day.  I will see her back in 3 months.

## 2021-01-14 NOTE — Patient Instructions (Signed)
Medication Instructions:   -Increase atenolol (tenormin) to 25mg  once daily.   *If you need a refill on your cardiac medications before your next appointment, please call your pharmacy*   Follow-Up: At [facility], you and your health needs are our priority.  As part of our continuing mission to provide you with exceptional heart care, we have created designated Provider Care Teams.  These Care Teams include your primary Cardiologist (physician) and Advanced Practice Providers (APPs -  Physician Assistants and Nurse Practitioners) who all work together to provide you with the care you need, when you need it.  We recommend signing up for the patient portal called "MyChart".  Sign up information is provided on this After Visit Summary.  MyChart is used to connect with patients for Virtual Visits (Telemedicine).  Patients are able to view lab/test results, encounter notes, upcoming appointments, etc.  Non-urgent messages can be sent to your provider as well.   To learn more about what you can do with MyChart, go to [email or web address].    Your next appointment:   3 month(s)  The format for your next appointment:   In Person  Provider:   Quay Burow, MD

## 2021-01-14 NOTE — Progress Notes (Addendum)
01/14/2021 Amy Bailey   1951/01/08  [id number]  Primary Physician Street, Sharon Mt, MD Primary Cardiologist: Lorretta Harp MD Amy Bailey, [place]  HPI:  Amy Bailey is a 71 y.o.  thin appearing married Caucasian female mother of 75, grandmother of 1 grandchild who is husband [name] is also a patient of mine.  She was referred by her PCP, Dr. Christa See, for evaluation of tachypalpitations and a recent event monitor that apparently showed atrial tachycardia.  I last saw her in the office 11/15/2020.  She has no cardiac risk factors.  She is never had a heart attack or stroke.  There is no family history of heart disease except that her brother did die suddenly of unclear causes.  She gets occasional atypical chest pain.  She says that she began to have tachypalpitations a month ago which predated her husband's recent hospitalization.  The palpitations occur fairly frequently, last up to 5 minutes at a time without other associated symptoms.  An event monitor that she wore for 7 days ordered by Dr. Venetia Maxon apparently showed atrial tachycardia although I currently have not been able to assess this personally.   I performed 2D echocardiography that revealed normal LV systolic function with moderate AI.  A coronary calcium score was 0.  Her palpitations did somewhat improve on low-dose atenolol.   Current Meds  Medication Sig   Docusate Calcium (STOOL SOFTENER PO) Take 2 tablets by mouth daily.   Omega-3 Fatty Acids (FISH OIL PO) Take 2 tablets by mouth daily.   Polyethylene Glycol 3350 (MIRALAX PO) Take by mouth.   [DISCONTINUED] atenolol (TENORMIN) 25 MG tablet Take 0.5 tablets (12.5 mg total) by mouth daily.     No Known Allergies  Social History   Socioeconomic History   Marital status: Married    Spouse name: Not on file   Number of children: Not on file   Years of education: Not on file   Highest education level: Not on file  Occupational History   Not on  file  Tobacco Use   Smoking status: Never   Smokeless tobacco: Never  Substance and Sexual Activity   Alcohol use: No   Drug use: No   Sexual activity: Not on file  Other Topics Concern   Not on file  Social History Narrative   She lives with her husband.    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  Cardiovascular: negative for chest pain, dyspnea on exertion, edema, orthopnea, palpitations, paroxysmal nocturnal dyspnea or shortness of breath Dermatological: negative for rash Respiratory: negative for cough or wheezing Urologic: negative for hematuria Abdominal: negative for nausea, vomiting, diarrhea, bright red blood per rectum, melena, or hematemesis Neurologic: negative for visual changes, syncope, or dizziness All other systems reviewed and are otherwise negative except as noted above.    Blood pressure 132/60, pulse (!) 55, height 5\' 2"  (1.575 m), weight 114 lb (51.7 kg), SpO2 99 %.  General appearance: alert and no distress Neck: no adenopathy, no carotid bruit, no  JVD, supple, symmetrical, trachea midline, and thyroid not enlarged, symmetric, no tenderness/mass/nodules Lungs: clear to auscultation bilaterally Heart: regular rate and rhythm, S1, S2 normal, no murmur, click, rub or gallop Extremities: extremities normal, atraumatic, no cyanosis or edema Pulses: 2+ and symmetric Skin: Skin color, texture, turgor normal. No rashes or lesions Neurologic: Grossly normal  EKG not performed today  ASSESSMENT AND PLAN:   Palpitations Ms. Sons returns today for  follow-up of palpitations.  She apparently had an event monitor performed by her PCP that showed atrial tachycardia although I have not personally reviewed this.  I performed 2D echocardiography on her that showed normal LV systolic function with moderate AI.  A coronary calcium score was 0.  Her palpitations did somewhat improve on atenolol 12.5 mg which we will increase to 25 mg a day.  I will see her back in 3 months.     Lorretta Harp MD FACP,FACC,FAHA, [facility] 01/14/2021 1:48 PM  Addendum: Elwyn Reach patch personally reviewed performed 10/29/2020 revealed 6 runs of atrial tachycardia with isolated PACs and rare PVCs.  Lorretta Harp, M.D., [place], [facility], [place] [address] [place], [place]  [address]  [id number] 01/14/2021 2:04 PM

## 2021-02-24 DIAGNOSIS — E782 Mixed hyperlipidemia: Secondary | ICD-10-CM | POA: Diagnosis not present

## 2021-02-24 DIAGNOSIS — I700 Atherosclerosis of aorta: Secondary | ICD-10-CM | POA: Diagnosis not present

## 2021-02-24 DIAGNOSIS — G720 Drug-induced myopathy: Secondary | ICD-10-CM | POA: Diagnosis not present

## 2021-02-24 DIAGNOSIS — I471 Supraventricular tachycardia: Secondary | ICD-10-CM | POA: Diagnosis not present

## 2021-03-28 ENCOUNTER — Ambulatory Visit: Payer: Medicare Other | Admitting: Cardiovascular Disease

## 2021-03-28 ENCOUNTER — Encounter: Payer: Self-pay | Admitting: Cardiovascular Disease

## 2021-03-28 DIAGNOSIS — I351 Nonrheumatic aortic (valve) insufficiency: Secondary | ICD-10-CM | POA: Insufficient documentation

## 2021-03-28 DIAGNOSIS — R002 Palpitations: Secondary | ICD-10-CM

## 2021-03-28 DIAGNOSIS — E785 Hyperlipidemia, unspecified: Secondary | ICD-10-CM | POA: Insufficient documentation

## 2021-03-28 DIAGNOSIS — E782 Mixed hyperlipidemia: Secondary | ICD-10-CM

## 2021-03-28 MED ORDER — ROSUVASTATIN CALCIUM 5 MG PO TABS: 5.0000 mg | ORAL_TABLET | ORAL | 11 refills | Status: DC

## 2021-03-28 NOTE — Assessment & Plan Note (Signed)
Mild hyperlipidemia with lipid profile performed 02/24/2021 revealing a total cholesterol 232.  LDL was not reported.  She does have a coronary calcium score of 0.  I believe she would do better on on a low-dose statin drug with shooting for an LDL less than 100.  We will start her on rosuvastatin 5 mg every other day and we will recheck a lipid liver profile in 3 months. ?

## 2021-03-28 NOTE — Assessment & Plan Note (Signed)
Moderate aortic insufficiency on 2D echo performed 12/10/2020 with normal LV size and function.  She is asymptomatic from this.  We will repeat a 2D echo in November of this year. ?

## 2021-03-28 NOTE — Progress Notes (Addendum)
? ? ? ?05/16/2021 ?Amy Bailey   ?Jul 07, 1950  ?[id number] ? ?Primary Physician Street, Sharon Mt, MD ?Primary Cardiologist: Lorretta Harp MD [name], [place] ? ?HPI:  Amy Bailey is a 71 y.o.  thin appearing married Caucasian female mother of 67, grandmother of 1 grandchild who is husband [name] is also a patient of mine.  She was referred by her PCP, Dr. Christa See, for evaluation of tachypalpitations and a recent event monitor that apparently showed atrial tachycardia.  I last saw her in the office 01/14/2021.  She has no cardiac risk factors.  She is never had a heart attack or stroke.  There is no family history of heart disease except that her brother did die suddenly of unclear causes.  She gets occasional atypical chest pain.  She says that she began to have tachypalpitations a month ago which predated her husband's recent hospitalization.  The palpitations occur fairly frequently, last up to 5 minutes at a time without other associated symptoms.  An event monitor that she wore for 7 days ordered by Dr. Venetia Maxon apparently showed atrial tachycardia although I currently have not been able to assess this personally. ?  ?I performed 2D echocardiography that revealed normal LV systolic function with moderate AI.  A coronary calcium score was 0.  Her palpitations did somewhat improve on low-dose atenolol. ? ?Since I saw her 3 months ago her palpitations have since significantly improved on low-dose atenolol.  She denies chest pain or shortness of breath.  Her cholesterol is moderately elevated at 232 on 02/24/2021. ? ? ?Current Meds  ?Medication Sig  ? atenolol (TENORMIN) 25 MG tablet Take 1 tablet (25 mg total) by mouth daily.  ? Calcium Carb-Cholecalciferol (CALCIUM 600 + D PO) Take 1 tablet by mouth daily.  ? Docusate Calcium (STOOL SOFTENER PO) Take 2 tablets by mouth daily.  ? Omega-3 Fatty Acids (FISH OIL PO) Take 2 tablets by mouth daily.  ? Polyethylene Glycol 3350 (MIRALAX PO) Take by  mouth.  ? rosuvastatin (CRESTOR) 5 MG tablet Take 1 tablet (5 mg total) by mouth every other day.  ?  ? ?No Known Allergies ? ?Social History  ? ?Socioeconomic History  ? Marital status: Married  ?  Spouse name: Not on file  ? Number of children: Not on file  ? Years of education: Not on file  ? Highest education level: Not on file  ?Occupational History  ? Not on file  ?Tobacco Use  ? Smoking status: Never  ? Smokeless tobacco: Never  ?Substance and Sexual Activity  ? Alcohol use: No  ? Drug use: No  ? Sexual activity: Not on file  ?Other Topics Concern  ? Not on file  ?Social History Narrative  ? She lives with her husband.   ? 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 Highest level of education:  10th  ? ?Social Determinants of Health  ? ?Financial Resource Strain: Not on file  ?Food Insecurity: Not on file  ?Transportation Needs: Not on file  ?Physical Activity: Not on file  ?Stress: Not on file  ?Social Connections: Not on file  ?Intimate Partner Violence: Not on file  ?  ? ?Review of Systems: ?General: negative for chills, fever, night sweats or weight changes.  ?Cardiovascular: negative for chest pain, dyspnea on exertion, edema, orthopnea, palpitations, paroxysmal nocturnal dyspnea or shortness of breath ?Dermatological: negative for rash ?Respiratory: negative for cough or wheezing ?Urologic: negative for hematuria ?Abdominal: negative  for nausea, vomiting, diarrhea, bright red blood per rectum, melena, or hematemesis ?Neurologic: negative for visual changes, syncope, or dizziness ?All other systems reviewed and are otherwise negative except as noted above. ? ? ? ?Blood pressure (!) 136/54, pulse 85, height '5\' 1"'$  (1.549 m), weight 116 lb 6.4 oz (52.8 kg), SpO2 99 %.  ?General appearance: alert and no distress ?Neck: no adenopathy, no carotid bruit, no JVD, supple, symmetrical, trachea midline, and thyroid not enlarged, symmetric, no tenderness/mass/nodules ?Lungs: clear to auscultation  bilaterally ?Heart: regular rate and rhythm, S1, S2 normal, no murmur, click, rub or gallop ?Extremities: extremities normal, atraumatic, no cyanosis or edema ?Pulses: 2+ and symmetric ?Skin: Skin color, texture, turgor normal. No rashes or lesions ?Neurologic: Grossly normal ? ?EKG not performed today ? ?ASSESSMENT AND PLAN:  ? 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 ? ?Addendum: Zio patch performed 10/29/2020 showed 6 episodes of PSVT.  The fastest heart rate was 160 lasting 5 beats. ? ?Aortic insufficiency ?Moderate aortic insufficiency on 2D echo performed 12/10/2020 with normal LV size and function.  She is asymptomatic from this.  We will repeat a 2D echo in November of this year. ? ?Hyperlipidemia ?Mild hyperlipidemia with lipid profile performed 02/24/2021 revealing a total cholesterol 232.  LDL was not reported.  She does have a coronary calcium score of 0.  I believe she would do better on on a low-dose statin drug with shooting for an LDL less than 100.  We will start her on rosuvastatin 5 mg every other day and we will recheck a lipid liver profile in 3 months. ? ? ? ? ?Lorretta Harp MD FACP,FACC,FAHA, FSCAI ?05/16/2021 ?4:52 PM ?

## 2021-03-28 NOTE — Patient Instructions (Signed)
Medication Instructions:  ?START Rosuvastatin 5 mg every other day ? ?*If you need a refill on your cardiac medications before your next appointment, please call your pharmacy* ? ? ?Lab Work: ?Your provider would like for you to return in 2 months to have the following labs drawn: fasting lipid and liver. You do not need an appointment for the lab. Once in our office lobby there is a podium where you can sign in and ring the doorbell to alert [place] that you are here. The lab is open from 8:00 am to 4:30 pm; closed for lunch from 12:45pm-1:45pm. ? ?If you have labs (blood work) drawn today and your tests are completely normal, you will receive your results only by: ?MyChart Message (if you have MyChart) OR ?A paper copy in the mail ?If you have any lab test that is abnormal or we need to change your treatment, we will call you to review the results. ? ? ?Testing/Procedures: ?Your physician has requested that you have an echocardiogram in November. Echocardiography is a painless test that uses sound waves to create images of your heart. It provides your doctor with information about the size and shape of your heart and how well your heart?s chambers and valves are working. You may receive an ultrasound enhancing agent through an IV if needed to better visualize your heart during the echo.This procedure takes approximately one hour. There are no restrictions for this procedure. This will take place at the [address].  ? ? ? ?Follow-Up: ?At [facility], you and your health needs are our priority.  As part of our continuing mission to provide you with exceptional heart care, we have created designated Provider Care Teams.  These Care Teams include your primary Cardiologist (physician) and Advanced Practice Providers (APPs -  Physician Assistants and Nurse Practitioners) who all work together to provide you with the care you need, when you need it. ? ?We recommend signing up for the patient portal called  "MyChart".  Sign up information is provided on this After Visit Summary.  MyChart is used to connect with patients for Virtual Visits (Telemedicine).  Patients are able to view lab/test results, encounter notes, upcoming appointments, etc.  Non-urgent messages can be sent to your provider as well.   ?To learn more about what you can do with MyChart, go to [email or web address].   ? ?Your next appointment:   ?12 month(s) ? ?The format for your next appointment:   ?In Person ? ?Provider:   ?Dr. Gwenlyn Found ?

## 2021-03-28 NOTE — Assessment & Plan Note (Addendum)
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 ? ?Addendum: Zio patch performed 10/29/2020 showed 6 episodes of PSVT.  The fastest heart rate was 160 lasting 5 beats. ?

## 2021-04-07 ENCOUNTER — Other Ambulatory Visit (HOSPITAL_COMMUNITY): Payer: Medicare Other

## 2021-05-27 DIAGNOSIS — J0100 Acute maxillary sinusitis, unspecified: Secondary | ICD-10-CM | POA: Diagnosis not present

## 2021-05-27 DIAGNOSIS — R519 Headache, unspecified: Secondary | ICD-10-CM | POA: Diagnosis not present

## 2021-06-03 DIAGNOSIS — R002 Palpitations: Secondary | ICD-10-CM | POA: Diagnosis not present

## 2021-06-03 DIAGNOSIS — E782 Mixed hyperlipidemia: Secondary | ICD-10-CM | POA: Diagnosis not present

## 2021-06-03 LAB — HEPATIC FUNCTION PANEL
ALT: 15 IU/L (ref 0–32)
AST: 18 IU/L (ref 0–40)
Albumin: 4.1 g/dL (ref 3.7–4.7)
Alkaline Phosphatase: 47 IU/L (ref 44–121)
Bilirubin Total: 0.2 mg/dL (ref 0.0–1.2)
Bilirubin, Direct: <0.1 mg/dL (ref 0.00–0.40)
Total Protein: 5.9 g/dL — ABNORMAL LOW (ref 6.0–8.5)

## 2021-06-03 LAB — LIPID PANEL
Chol/HDL Ratio: 1.9 ratio (ref 0.0–4.4)
Cholesterol, Total: 167 mg/dL (ref 100–199)
HDL: 86 mg/dL (ref >39)
LDL Chol Calc (NIH): 69 mg/dL (ref 0–99)
Triglycerides: 58 mg/dL (ref 0–149)
VLDL Cholesterol Cal: 12 mg/dL (ref 5–40)

## 2021-06-06 DIAGNOSIS — R519 Headache, unspecified: Secondary | ICD-10-CM | POA: Diagnosis not present

## 2021-06-06 DIAGNOSIS — G43909 Migraine, unspecified, not intractable, without status migrainosus: Secondary | ICD-10-CM | POA: Diagnosis not present

## 2021-07-22 ENCOUNTER — Encounter (HOSPITAL_COMMUNITY): Payer: Self-pay

## 2021-10-02 ENCOUNTER — Telehealth: Payer: Self-pay | Admitting: Cardiovascular Disease

## 2021-10-02 NOTE — Telephone Encounter (Signed)
Left message to call back   Per chart review, h/o know palpitations, wore heart monitor in the past

## 2021-10-02 NOTE — Telephone Encounter (Signed)
Patient c/o Palpitations:  High priority if patient c/o lightheadedness, shortness of breath, or chest pain  How long have you had palpitations/irregular HR/ Afib? Are you having the symptoms now? yes  Are you currently experiencing lightheadedness, SOB or CP? Cp (but dont last long nor is it everyday)  Do you have a history of afib (atrial fibrillation) or irregular heart rhythm? no  Have you checked your BP or HR? (document readings if available): no  Are you experiencing any other symptoms? Tiredness    Schedule appt on 9/26 with Eulas Post

## 2021-10-07 ENCOUNTER — Encounter: Payer: Self-pay | Admitting: Physician Assistant

## 2021-10-07 ENCOUNTER — Ambulatory Visit: Payer: Medicare Other | Attending: Physician Assistant | Admitting: Physician Assistant

## 2021-10-07 ENCOUNTER — Ambulatory Visit: Payer: Medicare Other | Attending: Physician Assistant

## 2021-10-07 VITALS — BP 146/62 | HR 51 | Ht 61.0 in | Wt 119.4 lb

## 2021-10-07 DIAGNOSIS — R002 Palpitations: Secondary | ICD-10-CM | POA: Diagnosis not present

## 2021-10-07 DIAGNOSIS — E782 Mixed hyperlipidemia: Secondary | ICD-10-CM

## 2021-10-07 NOTE — Progress Notes (Unsigned)
Cardiology Office Note:    Date:  10/09/2021   ID:  Amy Bailey, DOB 18-Jul-1950, MRN [id number]  PCP:  Street, Sharon Mt, MD   [place] Providers Cardiologist:  Quay Burow, MD     Referring MD: Street, Sharon Mt, *   Chief Complaint  Patient presents with   Follow-up    Seen for Dr. Gwenlyn Found    History of Present Illness:    Amy Bailey is a 71 y.o. female with a hx of hyperlipidemia and palpitation.  Patient was initially referred to cardiology service for evaluation of tachypalpitations.  She does not have significant cardiac risk factors and has never had a heart attack or stroke.  There is no family history of heart disease except her brother died suddenly of unclear cause.  She occasionally has atypical chest pain.  Her PCP ordered a 7-day heart monitor in October 2022 which shows atrial tachycardia.  Subsequent echocardiogram obtained on 12/10/2020 showed normal EF, moderate AI. She has a coronary calcium score of 0 on CT 12/10/2020.  Palpitation improved with atenolol.  Patient was last seen by Dr. Gwenlyn Found on 03/28/2021 at which time she was doing well on low-dose atenolol.  Patient has been having increased palpitation and fatigue for the past month.  She initially described a sudden onset of palpitation while at rest and slow offset.  She does not have any exertional symptoms.  She does have occasional pain and tingling sensation in the chest that only last few seconds at a time.  Her atypical chest pain does not sound cardiac in nature.  As for increased palpitation, I am hesitant to increase atenolol given baseline bradycardia.  EKG shows sinus bradycardia with heart rate of 49 bpm.  I recommended a repeat 2 weeks heart monitor to assess the underlying rhythm.  She has a scheduled echocardiogram in November to follow-up on aortic regurgitation.  We will see the patient after the echocardiogram.  Otherwise she has known extremity edema, and her lung is  clear.  Note, some of her fatigue may be related to caring for her husband who is also a patient of Dr. Kennon Holter and he has significant bilateral weeping lower extremity edema.  Past Medical History:  Diagnosis Date   Hypercholesteremia     Past Surgical History:  Procedure Laterality Date   APPENDECTOMY     NECK SURGERY     RADIOACTIVE SEED GUIDED EXCISIONAL BREAST BIOPSY Right 05/01/2020   Procedure: RIGHT BREAST SEED LOCALIZED EXCISIONAL BIOPSY;  Surgeon: Stark Klein, MD;  Location: [facility];  Service: General;  Laterality: Right;   SHOULDER SURGERY      Current Medications: Current Meds  Medication Sig   atenolol (TENORMIN) 25 MG tablet Take 1 tablet (25 mg total) by mouth daily.   Calcium Carb-Cholecalciferol (CALCIUM 600 + D PO) Take 1 tablet by mouth daily.   Docusate Calcium (STOOL SOFTENER PO) Take 2 tablets by mouth daily.   Polyethylene Glycol 3350 (MIRALAX PO) Take by mouth.   rosuvastatin (CRESTOR) 5 MG tablet Take 1 tablet (5 mg total) by mouth every other day.     Allergies:   Patient has no known allergies.   Social History   Socioeconomic History   Marital status: Married    Spouse name: Not on file   Number of children: Not on file   Years of education: Not on file   Highest education level: Not on file  Occupational History   Not on  file  Tobacco Use   Smoking status: Never   Smokeless tobacco: Never  Substance and Sexual Activity   Alcohol use: No   Drug use: No   Sexual activity: Not on file  Other Topics Concern   Not on file  Social History Narrative   She lives with her husband.    She retired in 2014 at a [employer] level of education:  10th   Social Determinants of Radio broadcast assistant Strain: Not on [employer] Insecurity: Not on file  Transportation Needs: Not on file  Physical Activity: Not on file  Stress: Not on file  Social Connections: Not on file     Family History: The  patient's family history includes Diabetes Mellitus I in her brother and mother; Heart attack in her brother and father; Hypertension in her father; Pancreatic cancer in her mother.  ROS:   Please see the history of present illness.     All other systems reviewed and are negative.  EKGs/Labs/Other Studies Reviewed:    The following studies were reviewed today:  Echo 12/10/2020  1. Moderate aortic regurgitation.   2. Left ventricular ejection fraction by 3D volume is 72 %. The left  ventricle has normal function. The left ventricle has no regional wall  motion abnormalities. Left ventricular diastolic parameters were normal.  The average left ventricular global  longitudinal strain is -21.6 %. The global longitudinal strain is normal.   3. Right ventricular systolic function is normal. The right ventricular  size is normal.   4. The mitral valve is normal in structure. No evidence of mitral valve  regurgitation. No evidence of mitral stenosis.   5. The aortic valve is normal in structure. Aortic valve regurgitation is  moderate. No aortic stenosis is present.   6. The inferior vena cava is normal in size with greater than 50%  respiratory variability, suggesting right atrial pressure of 3 mmHg.   EKG:  EKG is ordered today.  The ekg ordered today demonstrates sinus bradycardia, heart rate 49 bpm, no significant ST-T wave changes.  Recent Labs: 11/15/2020: TSH 2.380 06/03/2021: ALT 15  Recent Lipid Panel    Component Value Date/Time   CHOL 167 06/03/2021 0946   TRIG 58 06/03/2021 0946   HDL 86 06/03/2021 0946   CHOLHDL 1.9 06/03/2021 0946   LDLCALC 69 06/03/2021 0946     Risk Assessment/Calculations:           Physical Exam:    VS:  BP (!) 146/62   Pulse (!) 51   Ht '5\' 1"'$  (1.549 m)   Wt 119 lb 6.4 oz (54.2 kg)   SpO2 99%   BMI 22.56 kg/m        Wt Readings from Last 3 Encounters:  10/07/21 119 lb 6.4 oz (54.2 kg)  03/28/21 116 lb 6.4 oz (52.8 kg)  01/14/21  114 lb (51.7 kg)     GEN:  Well nourished, well developed in no acute distress HEENT: Normal NECK: No JVD; No carotid bruits LYMPHATICS: No lymphadenopathy CARDIAC: RRR, no murmurs, rubs, gallops RESPIRATORY:  Clear to auscultation without rales, wheezing or rhonchi  ABDOMEN: Soft, non-tender, non-distended MUSCULOSKELETAL:  No edema; No deformity  SKIN: Warm and dry NEUROLOGIC:  Alert and oriented x 3 PSYCHIATRIC:  Normal affect   ASSESSMENT:    1. Palpitations   2. Mixed hyperlipidemia    PLAN:    In order of problems listed above:  Palpitation: Currently on  atenolol, unable to further increase the dosage given baseline bradycardia.  She described increased fatigue and palpitation recently that had sudden onset and slow offset.  She had a previous heart monitor in 2022 that demonstrated atrial tachycardia, coronary calcium score in 2022 showed a 0 calcium score.  I recommend a repeat in 2 weeks heart monitor  Hyperlipidemia: On Crestor and fish oil.           Medication Adjustments/Labs and Tests Ordered: Current medicines are reviewed at length with the patient today.  Concerns regarding medicines are outlined above.  Orders Placed This Encounter  Procedures   LONG TERM MONITOR (3-14 DAYS)   EKG 12-Lead   No orders of the defined types were placed in this encounter.   Patient Instructions  Medication Instructions:  Your physician recommends that you continue on your current medications as directed. Please refer to the Current Medication list given to you today.  *If you need a refill on your cardiac medications before your next appointment, please call your pharmacy*   Lab Work: NONE ordered at this time of appointment   If you have labs (blood work) drawn today and your tests are completely normal, you will receive your results only by: [place] (if you have MyChart) OR A paper copy in the mail If you have any lab test that is abnormal or we need to  change your treatment, we will call you to review the results.   Testing/Procedures: Bryn Gulling- Long Term Monitor Instructions  Your physician has requested you wear a ZIO patch monitor for 14 days.  This is a single patch monitor. [employer] supplies one patch monitor per enrollment. Additional stickers are not available. Please do not apply patch if you will be having a Nuclear Stress Test,  Echocardiogram, Cardiac CT, MRI, or Chest Xray during the period you would be wearing the  monitor. The patch cannot be worn during these tests. You cannot remove and re-apply the  ZIO XT patch monitor.  Your ZIO patch monitor will be mailed 3 day USPS to your address on file. It may take 3-5 days  to receive your monitor after you have been enrolled.  Once you have received your monitor, please review the enclosed instructions. Your monitor  has already been registered assigning a specific monitor serial # to you.  Billing and Patient Assistance Program Information  We have supplied [employer] with any of your insurance information on file for billing purposes. [employer] offers a sliding scale Patient Assistance Program for patients that do not have  insurance, or whose insurance does not completely cover the cost of the ZIO monitor.  You must apply for the Patient Assistance Program to qualify for this discounted rate.  To apply, please call [employer] at [phone], select option 4, select option 2, ask to apply for  Patient Assistance Program. Theodore Demark will ask your household income, and how many people  are in your household. They will quote your out-of-pocket cost based on that information.  [employer] will also be able to set up a 40-month interest-free payment plan if needed.  Applying the monitor Shave hair from upper left chest.  Hold abrader disc by orange tab. Rub abrader in 40 strokes over the upper left chest as  indicated in your monitor instructions.  Clean area with 4 enclosed alcohol pads.  Let dry.  Apply patch as indicated in monitor instructions. Patch will be placed under collarbone on left  side of chest with arrow pointing upward.  Rub patch adhesive wings for 2 minutes. Remove white label marked "1". Remove the white  label marked "2". Rub patch adhesive wings for 2 additional minutes.  While looking in a mirror, press and release button in center of patch. A small green light will  flash 3-4 times. This will be your only indicator that the monitor has been turned on.  Do not shower for the first 24 hours. You may shower after the first 24 hours.  Press the button if you feel a symptom. You will hear a small click. Record Date, Time and  Symptom in the Patient Logbook.  When you are ready to remove the patch, follow instructions on the last 2 pages of Patient  Logbook. Stick patch monitor onto the last page of Patient Logbook.  Place Patient Logbook in the blue and white box. Use locking tab on box and tape box closed  securely. The blue and white box has prepaid postage on it. Please place it in the mailbox as  soon as possible. Your physician should have your test results approximately 7 days after the  monitor has been mailed back to [facility].  Call [place] at [phone] if you have questions regarding  your ZIO XT patch monitor. Call them immediately if you see an orange light blinking on your  monitor.  If your monitor falls off in less than 4 days, contact our Monitor department at [phone].  If your monitor becomes loose or falls off after 4 days call [employer] at [phone] for  suggestions on securing your monitor    Follow-Up: At [facility], you and your health needs are our priority.  As part of our continuing mission to provide you with exceptional heart care, we have created designated Provider Care Teams.  These Care Teams include your primary Cardiologist (physician) and Advanced Practice Providers (APPs -   Physician Assistants and Nurse Practitioners) who all work together to provide you with the care you need, when you need it.  We recommend signing up for the patient portal called "MyChart".  Sign up information is provided on this After Visit Summary.  MyChart is used to connect with patients for Virtual Visits (Telemedicine).  Patients are able to view lab/test results, encounter notes, upcoming appointments, etc.  Non-urgent messages can be sent to your provider as well.   To learn more about what you can do with MyChart, go to [email or web address].    Other Instructions Please follow up with Almyra Deforest, PA after Echo is done   Important Information About Sugar         Hilbert Corrigan, [place]  10/09/2021 11:27 PM    [place]

## 2021-10-07 NOTE — Patient Instructions (Addendum)
Medication Instructions:  Your physician recommends that you continue on your current medications as directed. Please refer to the Current Medication list given to you today.  *If you need a refill on your cardiac medications before your next appointment, please call your pharmacy*   Lab Work: NONE ordered at this time of appointment   If you have labs (blood work) drawn today and your tests are completely normal, you will receive your results only by: [place] (if you have MyChart) OR A paper copy in the mail If you have any lab test that is abnormal or we need to change your treatment, we will call you to review the results.   Testing/Procedures: Bryn Gulling- Long Term Monitor Instructions  Your physician has requested you wear a ZIO patch monitor for 14 days.  This is a single patch monitor. [employer] supplies one patch monitor per enrollment. Additional stickers are not available. Please do not apply patch if you will be having a Nuclear Stress Test,  Echocardiogram, Cardiac CT, MRI, or Chest Xray during the period you would be wearing the  monitor. The patch cannot be worn during these tests. You cannot remove and re-apply the  ZIO XT patch monitor.  Your ZIO patch monitor will be mailed 3 day USPS to your address on file. It may take 3-5 days  to receive your monitor after you have been enrolled.  Once you have received your monitor, please review the enclosed instructions. Your monitor  has already been registered assigning a specific monitor serial # to you.  Billing and Patient Assistance Program Information  We have supplied [employer] with any of your insurance information on file for billing purposes. [employer] offers a sliding scale Patient Assistance Program for patients that do not have  insurance, or whose insurance does not completely cover the cost of the ZIO monitor.  You must apply for the Patient Assistance Program to qualify for this discounted rate.  To apply,  please call [employer] at [phone], select option 4, select option 2, ask to apply for  Patient Assistance Program. Theodore Demark will ask your household income, and how many people  are in your household. They will quote your out-of-pocket cost based on that information.  [employer] will also be able to set up a 30-month interest-free payment plan if needed.  Applying the monitor Shave hair from upper left chest.  Hold abrader disc by orange tab. Rub abrader in 40 strokes over the upper left chest as  indicated in your monitor instructions.  Clean area with 4 enclosed alcohol pads. Let dry.  Apply patch as indicated in monitor instructions. Patch will be placed under collarbone on left  side of chest with arrow pointing upward.  Rub patch adhesive wings for 2 minutes. Remove white label marked "1". Remove the white  label marked "2". Rub patch adhesive wings for 2 additional minutes.  While looking in a mirror, press and release button in center of patch. A small green light will  flash 3-4 times. This will be your only indicator that the monitor has been turned on.  Do not shower for the first 24 hours. You may shower after the first 24 hours.  Press the button if you feel a symptom. You will hear a small click. Record Date, Time and  Symptom in the Patient Logbook.  When you are ready to remove the patch, follow instructions on the last 2 pages of Patient  Logbook. Stick patch monitor onto the last page of Patient  Logbook.  Place Patient Logbook in the blue and white box. Use locking tab on box and tape box closed  securely. The blue and white box has prepaid postage on it. Please place it in the mailbox as  soon as possible. Your physician should have your test results approximately 7 days after the  monitor has been mailed back to [facility].  Call [place] at [phone] if you have questions regarding  your ZIO XT patch monitor. Call them immediately if you see an  orange light blinking on your  monitor.  If your monitor falls off in less than 4 days, contact our Monitor department at [phone].  If your monitor becomes loose or falls off after 4 days call [employer] at [phone] for  suggestions on securing your monitor    Follow-Up: At [facility], you and your health needs are our priority.  As part of our continuing mission to provide you with exceptional heart care, we have created designated Provider Care Teams.  These Care Teams include your primary Cardiologist (physician) and Advanced Practice Providers (APPs -  Physician Assistants and Nurse Practitioners) who all work together to provide you with the care you need, when you need it.  We recommend signing up for the patient portal called "MyChart".  Sign up information is provided on this After Visit Summary.  MyChart is used to connect with patients for Virtual Visits (Telemedicine).  Patients are able to view lab/test results, encounter notes, upcoming appointments, etc.  Non-urgent messages can be sent to your provider as well.   To learn more about what you can do with MyChart, go to [email or web address].    Other Instructions Please follow up with Almyra Deforest, PA after Echo is done   Important Information About [place]

## 2021-10-07 NOTE — Progress Notes (Unsigned)
Enrolled for [employer] to mail a ZIO XT long term holter monitor to the patients address on file.   Dr. Berry to read. 

## 2021-10-07 NOTE — Telephone Encounter (Signed)
Patient has an appointment today with the app

## 2021-10-09 ENCOUNTER — Encounter: Payer: Self-pay | Admitting: Physician Assistant

## 2021-10-14 DIAGNOSIS — R002 Palpitations: Secondary | ICD-10-CM | POA: Diagnosis not present

## 2021-10-15 DIAGNOSIS — J0140 Acute pansinusitis, unspecified: Secondary | ICD-10-CM | POA: Diagnosis not present

## 2021-10-15 DIAGNOSIS — R001 Bradycardia, unspecified: Secondary | ICD-10-CM | POA: Diagnosis not present

## 2021-10-15 DIAGNOSIS — I495 Sick sinus syndrome: Secondary | ICD-10-CM | POA: Diagnosis not present

## 2021-10-15 DIAGNOSIS — Y708 Miscellaneous anesthesiology devices associated with adverse incidents, not elsewhere classified: Secondary | ICD-10-CM | POA: Diagnosis not present

## 2021-11-05 DIAGNOSIS — Z23 Encounter for immunization: Secondary | ICD-10-CM | POA: Diagnosis not present

## 2021-12-08 ENCOUNTER — Ambulatory Visit (HOSPITAL_COMMUNITY): Payer: Medicare Other | Attending: Cardiology

## 2021-12-08 DIAGNOSIS — I351 Nonrheumatic aortic (valve) insufficiency: Secondary | ICD-10-CM | POA: Insufficient documentation

## 2021-12-08 DIAGNOSIS — E782 Mixed hyperlipidemia: Secondary | ICD-10-CM | POA: Insufficient documentation

## 2021-12-08 DIAGNOSIS — R002 Palpitations: Secondary | ICD-10-CM | POA: Diagnosis not present

## 2021-12-08 LAB — ECHOCARDIOGRAM COMPLETE
Area-P 1/2: 2.8 cm2
P 1/2 time: 581 msec
S' Lateral: 2.9 cm

## 2021-12-14 IMAGING — CT CT CARDIAC CORONARY ARTERY CALCIUM SCORE
3 series · 14 of 20 positions shown, 16 images · non-contrast
Comparison: None.
COMPARISON: None.

Addendum:
EXAM:
OVER-READ INTERPRETATION  CT CHEST

The following report is an over-read performed by radiologist Dr.
Adenyo Ologo [REDACTED] on 12/10/2020. This
over-read does not include interpretation of cardiac or coronary
anatomy or pathology. The coronary calcium score interpretation by
the cardiologist is attached.
TECHNIQUE: A gated, non-contrast computed tomography scan of the heart was
performed using 3mm slice thickness. Axial images were analyzed on a
dedicated workstation. Calcium scoring of the coronary arteries was
performed using the Agatston method.

[Series 2: cascseq 2.0 sa36 70% (id) · axial · 0.39mm/px · z∈[-168,-66]mm · 4 of 85 slices shown]
[im 17/85  vessel]
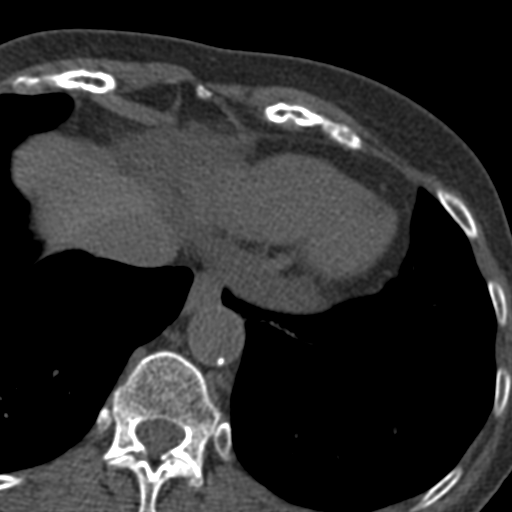
[im 34/85  vessel]
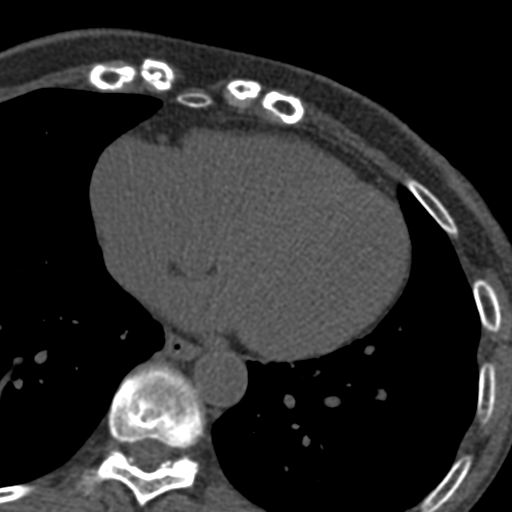
[im 51/85  vessel]
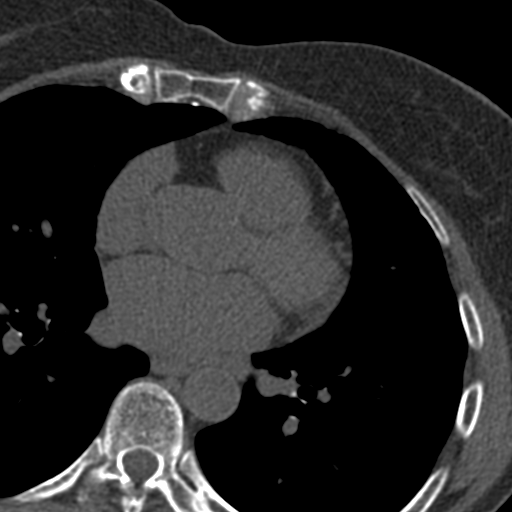
[im 68/85  vessel]
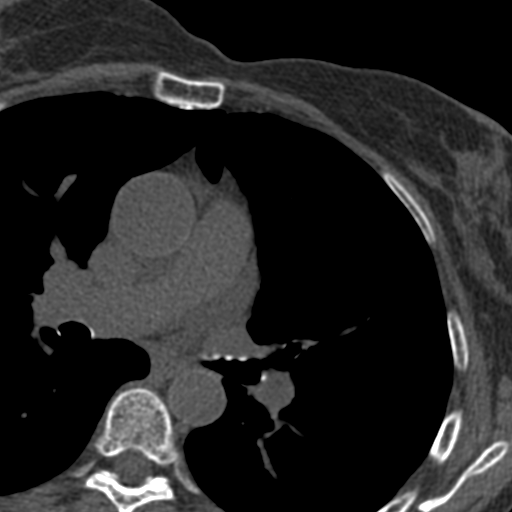

[Series 3: cascseq 2.0 bf37 st · axial · 0.68mm/px · z∈[-172,-60]mm · 5 of 85 slices shown, 7 images]
[im 15/85  vessel]
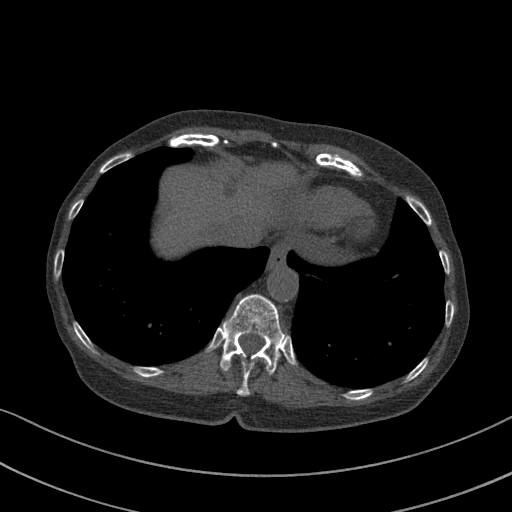
[im 15/85  lung]
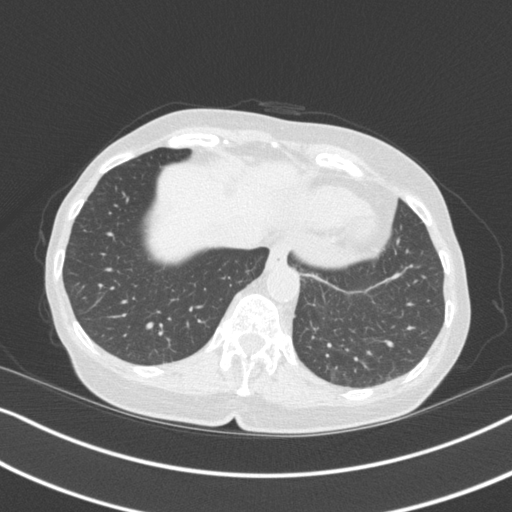
[im 29/85  vessel]
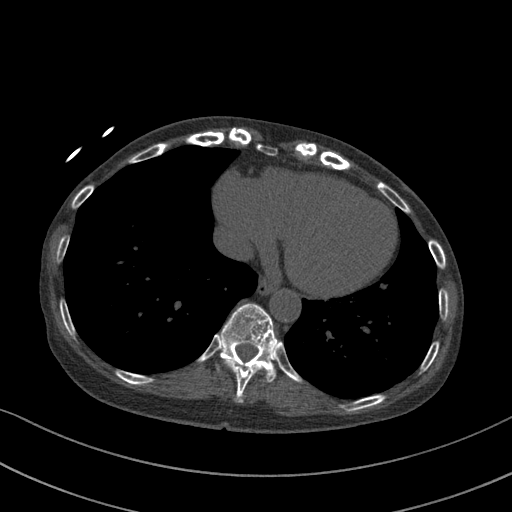
[im 43/85  vessel]
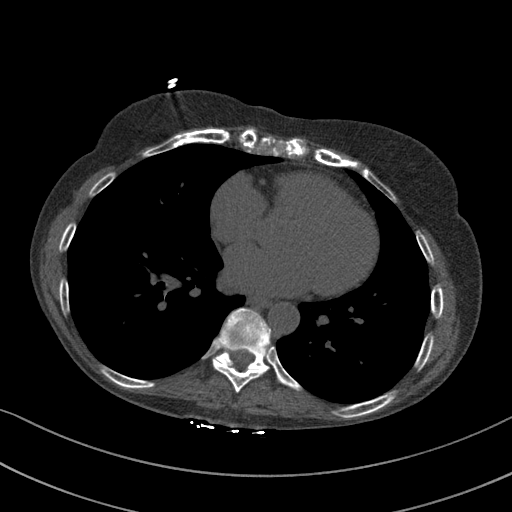
[im 57/85  vessel]
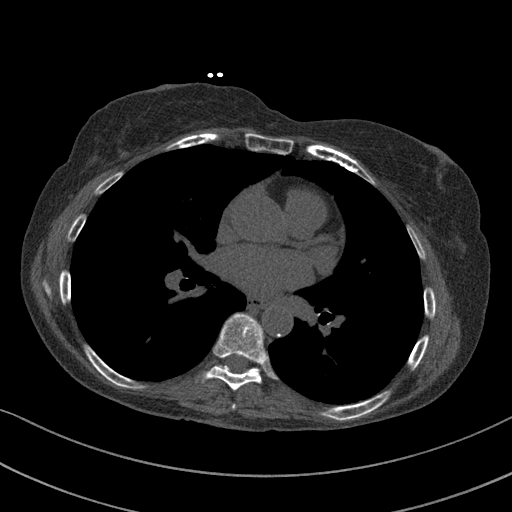
[im 71/85  vessel]
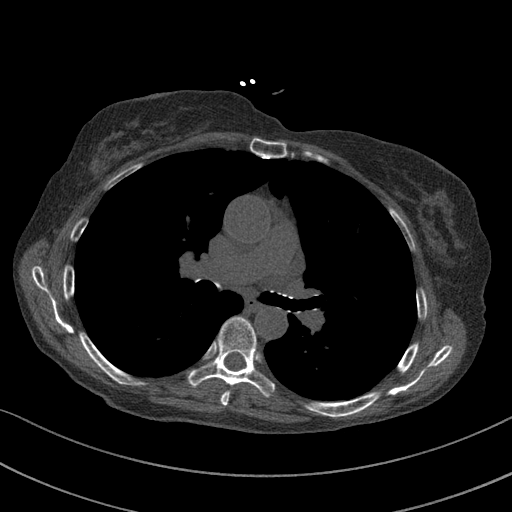
[im 71/85  lung]
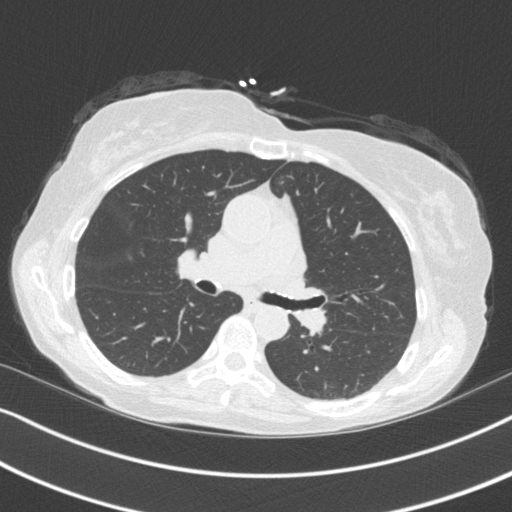

[Series 4: cascseq 2.0 br59 lung · axial · 0.68mm/px · z∈[-172,-60]mm · 5 of 85 slices shown]
[im 15/85  lung]
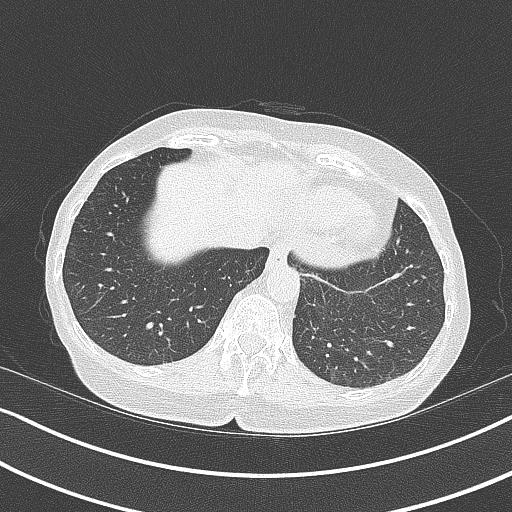
[im 29/85  lung]
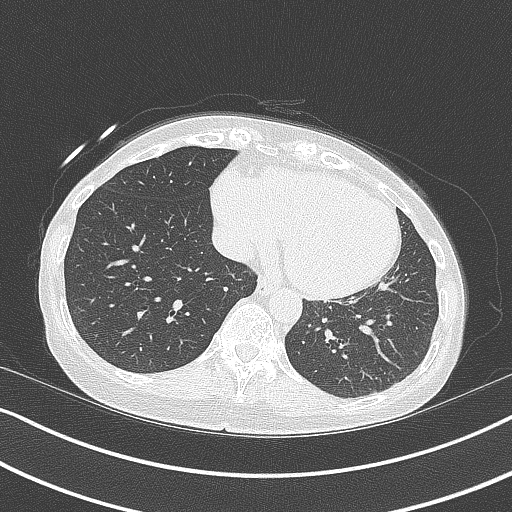
[im 43/85  lung]
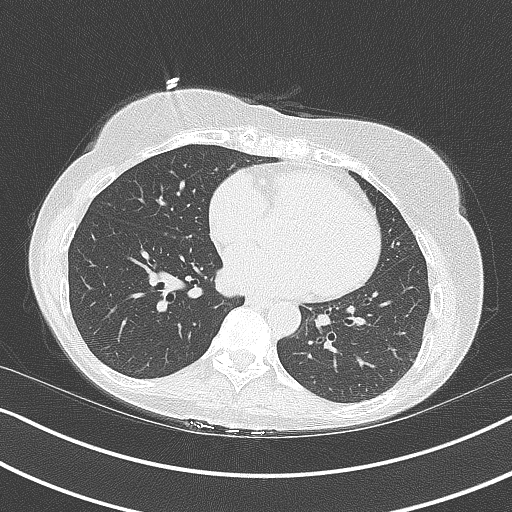
[im 57/85  lung]
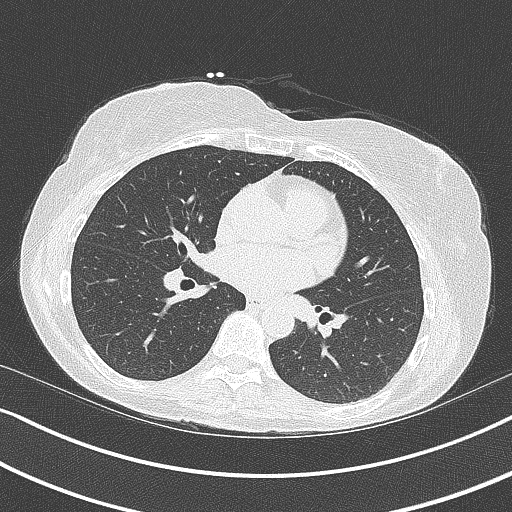
[im 71/85  lung]
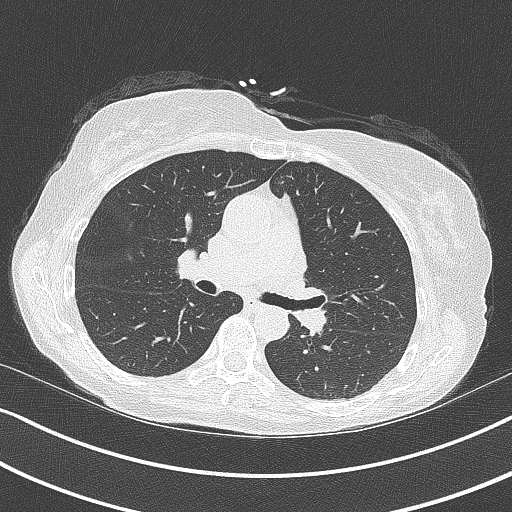

[14 of 20 positions shown; findings below may reference images not displayed]

FINDINGS: Vascular: Heart is normal size. Aorta normal caliber. Scattered
calcifications in the descending thoracic aorta.

Mediastinum/Nodes: No adenopathy

Lungs/Pleura: No confluent opacities or effusions

Upper Abdomen: 11 mm low-density lesion in the hepatic dome likely
reflect small cyst. No acute abnormality.

Musculoskeletal: Chest wall soft tissues are unremarkable. No acute
bony abnormality.
IMPRESSION: No acute extra cardiac abnormality.

Scattered aortic atherosclerosis.

ADDENDUM:
Cardiovascular Disease Risk stratification

EXAM:
Coronary Calcium Score
FINDINGS: Coronary arteries: Normal origins.

Coronary Calcium Score:

Left main: 0

Left anterior descending artery: 0

Left circumflex artery: 0

Right coronary artery: 0

Total: 0

Percentile: 0

Pericardium: Normal.

Ascending Aorta: Normal caliber.

Non-cardiac: See separate report from [REDACTED].
IMPRESSION: Coronary calcium score of 0. This was 0 percentile for age-, race-,
and sex-matched controls.



If CAC=0, it is reasonable to withhold statin therapy and reassess
in 5 to 10 years, as long as higher risk conditions are absent
(diabetes mellitus, family history of premature CHD in first degree
relatives (males <55 years; females <65 years), cigarette smoking,
or LDL >=190 mg/dL).

If CAC is 1 to 99, it is reasonable to initiate statin therapy for
patients >=55 years of age.

If CAC is >=100 or >=75th percentile, it is reasonable to initiate
statin therapy at any age.

Cardiology referral should be considered for patients with CAC
scores >=400 or >=75th percentile.

*5751 AHA/ACC/AACVPR/AAPA/ABC/BLAIN/IROLF/VID MARINA/Nivirus/LOAYZA/WELKER/PAPIK
Guideline on the Management of Blood Cholesterol: A Report of the
American College of Cardiology/American Heart Association Task Force
on Clinical Practice Guidelines. J Am Coll Cardiol.
2004;73(24):7358-7159.

Itzaris Shockness, DO

*** End of Addendum ***
EXAM:
OVER-READ INTERPRETATION  CT CHEST

The following report is an over-read performed by radiologist Dr.
Adenyo Ologo [REDACTED] on 12/10/2020. This
over-read does not include interpretation of cardiac or coronary
anatomy or pathology. The coronary calcium score interpretation by
the cardiologist is attached.
FINDINGS: Vascular: Heart is normal size. Aorta normal caliber. Scattered
calcifications in the descending thoracic aorta.

Mediastinum/Nodes: No adenopathy

Lungs/Pleura: No confluent opacities or effusions

Upper Abdomen: 11 mm low-density lesion in the hepatic dome likely
reflect small cyst. No acute abnormality.

Musculoskeletal: Chest wall soft tissues are unremarkable. No acute
bony abnormality.
IMPRESSION: No acute extra cardiac abnormality.

Scattered aortic atherosclerosis.

## 2021-12-16 DIAGNOSIS — R002 Palpitations: Secondary | ICD-10-CM | POA: Diagnosis not present

## 2022-01-01 ENCOUNTER — Ambulatory Visit: Payer: Medicare Other | Attending: Physician Assistant | Admitting: Physician Assistant

## 2022-01-01 ENCOUNTER — Encounter: Payer: Self-pay | Admitting: Physician Assistant

## 2022-01-01 VITALS — BP 116/72 | HR 54 | Ht 61.0 in | Wt 119.8 lb

## 2022-01-01 DIAGNOSIS — E785 Hyperlipidemia, unspecified: Secondary | ICD-10-CM | POA: Diagnosis not present

## 2022-01-01 DIAGNOSIS — R002 Palpitations: Secondary | ICD-10-CM | POA: Diagnosis not present

## 2022-01-01 DIAGNOSIS — I351 Nonrheumatic aortic (valve) insufficiency: Secondary | ICD-10-CM | POA: Diagnosis not present

## 2022-01-01 MED ORDER — ATENOLOL 25 MG PO TABS: 25.0000 mg | ORAL_TABLET | Freq: Every day | ORAL | 3 refills | Status: DC

## 2022-01-01 NOTE — Patient Instructions (Signed)
Medication Instructions:  No Changes *If you need a refill on your cardiac medications before your next appointment, please call your pharmacy*   Lab Work: No Labs If you have labs (blood work) drawn today and your tests are completely normal, you will receive your results only by: [place] (if you have MyChart) OR A paper copy in the mail If you have any lab test that is abnormal or we need to change your treatment, we will call you to review the results.   Testing/Procedures: No Testing   Follow-Up: At [facility], you and your health needs are our priority.  As part of our continuing mission to provide you with exceptional heart care, we have created designated Provider Care Teams.  These Care Teams include your primary Cardiologist (physician) and Advanced Practice Providers (APPs -  Physician Assistants and Nurse Practitioners) who all work together to provide you with the care you need, when you need it.  We recommend signing up for the patient portal called "MyChart".  Sign up information is provided on this After Visit Summary.  MyChart is used to connect with patients for Virtual Visits (Telemedicine).  Patients are able to view lab/test results, encounter notes, upcoming appointments, etc.  Non-urgent messages can be sent to your provider as well.   To learn more about what you can do with MyChart, go to [email or web address].    Your next appointment:   6-9 month(s)  The format for your next appointment:   In Person  Provider:   Quay Burow, MD

## 2022-01-01 NOTE — Progress Notes (Signed)
Cardiology Office Note:    Date:  01/03/2022   ID:  Amy, Bailey Apr 18, 1950, MRN [id number]  PCP:  Bailey, Amy Mt, MD   [facility] Prov[facility] Cardiologist:  Amy Burow, MD     Referring MD: Bailey, Amy Mt, *   Chief Complaint  Patient presents with   Follow-up    Seen for Dr. Gwenlyn Bailey    History of Present Illness:    Amy Bailey is a 71 y.o. female with a hx of hyperlipidemia and palpitation.  Patient was initially referred to cardiology service for evaluation of tachypalpitations.  She does not have significant cardiac risk factors and has never had a heart attack or stroke.  There is no family history of heart disease except her brother died suddenly of unclear cause.  She occasionally has atypical chest pain.  Her PCP ordered a 7-day heart monitor in October 2022 which shows atrial tachycardia.  Subsequent echocardiogram obtained on 12/10/2020 showed normal EF, moderate AI. She has a coronary calcium score of 0 on CT 12/10/2020.  Palpitation improved with atenolol.   I last saw the patient on 10/07/2021 for increased palpitation and the fatigue since August.  She also has occasional pain and tingling sensation in the chest that only last a few seconds at a time.  Her atypical chest pain did not sound cardiac in nature.  I was hesitant to increase atenolol given baseline bradycardia.  EKG at the time showed sinus bradycardia with heart rate 49 bpm.  I recommended a repeat 2-week heart monitor.  She also underwent echocardiogram on 12/08/2021 that showed EF 65 to 70%, mild biatrial enlargement, trivial mitral MR, mild to moderate AI.  Heart monitor shows minimal heart rate 44 bpm, maximal heart rate 136 bpm, average heart rate is 59 bpm.  7 episodes of SVT, longest duration 13 beats.  Isolated less than 1% of PVCs.  I spoke with the patient, she did complain of a prolonged palpitation for hours, however no such tachycardic rhythm was noted on the heart  monitor.  Her baseline bradycardia means we cannot uptitrate her atenolol.  I recommended continue on the current therapy.  Her husband's overall condition has improved, therefore she is not working at home as much as she used to.  I recommended continue on the current therapy she can follow-up with Dr. Gwenlyn Bailey in 6 to 9 months.  Past Medical History:  Diagnosis Date   Hypercholesteremia     Past Surgical History:  Procedure Laterality Date   APPENDECTOMY     NECK SURGERY     RADIOACTIVE SEED GUIDED EXCISIONAL BREAST BIOPSY Right 05/01/2020   Procedure: RIGHT BREAST SEED LOCALIZED EXCISIONAL BIOPSY;  Surgeon: Amy Klein, MD;  Location: [facility];  Service: General;  Laterality: Right;   SHOULDER SURGERY      Current Medications: Current Meds  Medication Sig   Calcium Carb-Cholecalciferol (CALCIUM 600 + D PO) Take 1 tablet by mouth daily.   Docusate Calcium (STOOL SOFTENER PO) Take 2 tablets by mouth daily.   Polyethylene Glycol 3350 (MIRALAX PO) Take by mouth.   [DISCONTINUED] atenolol (TENORMIN) 25 MG tablet Take 1 tablet (25 mg total) by mouth daily.     Allergies:   Patient has no known allergies.   Social History   Socioeconomic History   Marital status: Married    Spouse name: Not on file   Number of children: Not on file   Years of education: Not on file  Highest education level: Not on file  Occupational History   Not on file  Tobacco Use   Smoking status: Never   Smokeless tobacco: Never  Substance and Sexual Activity   Alcohol use: No   Drug use: No   Sexual activity: Not on file  Other Topics Concern   Not on file  Social History Narrative   She lives with her husband.    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  ROS:   Please see the history of present illness.     All other systems reviewed and are negative.  EKGs/Labs/Other Studies Reviewed:    The following studies were reviewed today:  Echo 12/08/2021  1. Left ventricular ejection fraction, by estimation, is 65 to 70%. Left  ventricular ejection fraction by 3D volume is 69 %. The left ventricle has  normal function. The left ventricle has no regional wall motion  abnormalities. Left ventricular diastolic   parameters were normal.   2. Right ventricular systolic function is normal. The right ventricular  size is normal. Tricuspid regurgitation signal is inadequate for assessing  PA pressure.   3. Left atrial size was mildly dilated.   4. Right atrial size was mildly dilated.   5. The mitral valve is normal in structure. Trivial mitral valve  regurgitation. No evidence of mitral stenosis.   6. The aortic valve is normal in structure. Aortic valve regurgitation is  mild by PHT but appears moderate by colorflow doppler. No aortic stenosis  is present. Aortic regurgitation PHT measures 581 msec.   7. The inferior vena cava is normal in size with greater than 50%  respiratory variability, suggesting right atrial pressure of 3 mmHg.    EKG:  EKG is not ordered today.    Recent Labs: 06/03/2021: ALT 15  Recent Lipid Panel    Component Value Date/Time   CHOL 167 06/03/2021 0946   TRIG 58 06/03/2021 0946   HDL 86 06/03/2021 0946   CHOLHDL 1.9 06/03/2021 0946   LDLCALC 69 06/03/2021 0946     Risk Assessment/Calculations:           Physical Exam:    VS:  BP 116/72   Pulse (!) 54   Ht '5\' 1"'$  (1.549 m)   Wt 119 lb 12.8 oz (54.3 kg)   SpO2 99%   BMI 22.64 kg/m        Wt  Readings from Last 3 Encounters:  01/01/22 119 lb 12.8 oz (54.3 kg)  10/07/21 119 lb 6.4 oz (54.2 kg)  03/28/21 116 lb 6.4 oz (52.8 kg)     GEN:  Well nourished, well developed in no acute distress HEENT: Normal NECK: No JVD; No carotid bruits LYMPHATICS: No lymphadenopathy CARDIAC: RRR, no murmurs, rubs, gallops RESPIRATORY:  Clear to auscultation without rales, wheezing or rhonchi  ABDOMEN: Soft, non-tender, non-distended MUSCULOSKELETAL:  No edema; No deformity  SKIN: Warm and dry NEUROLOGIC:  Alert and oriented x 3 PSYCHIATRIC:  Normal affect  ASSESSMENT:    1. Palpitations   2. Hyperlipidemia LDL goal <100   3. Nonrheumatic aortic valve insufficiency    PLAN:    In order of problems listed above:  Palpitation: Recent heart monitor shows no significant arrhythmia.  She has baseline bradycardia that prevented [place] from uptitrating beta-blocker dosage.  Hyperlipidemia: On Crestor  Aortic insufficiency: Mild to moderate AI noted on recent echocardiogram.  Repeat in 12 months.           Medication Adjustments/Labs and Tests Ordered: Current medicines are reviewed at length with the patient today.  Concerns regarding medicines are outlined above.  No orders of the defined types were placed in this encounter.  Meds ordered this encounter  Medications   atenolol (TENORMIN) 25 MG tablet    Sig: Take 1 tablet (25 mg total) by mouth daily.    Dispense:  90 tablet    Refill:  3    Patient Instructions  Medication Instructions:  No Changes *If you need a refill on your cardiac medications before your next appointment, please call your pharmacy*   Lab Work: No Labs If you have labs (blood work) drawn today and your tests are completely normal, you will receive your results only by: [place] (if you have MyChart) OR A paper copy in the mail If you have any lab test that is abnormal or we need to change your treatment, we will call you to review the  results.   Testing/Procedures: No Testing   Follow-Up: At [facility], you and your health needs are our priority.  As part of our continuing mission to provide you with exceptional heart care, we have created designated Provider Care Teams.  These Care Teams include your primary Cardiologist (physician) and Advanced Practice Providers (APPs -  Physician Assistants and Nurse Practitioners) who all work together to provide you with the care you need, when you need it.  We recommend signing up for the patient portal called "MyChart".  Sign up information is provided on this After Visit Summary.  MyChart is used to connect with patients for Virtual Visits (Telemedicine).  Patients are able to view lab/test results, encounter notes, upcoming appointments, etc.  Non-urgent messages can be sent to your provider as well.   To learn more about what you can do with MyChart, go to [email or web address].    Your next appointment:   6-9 month(s)  The format for your next appointment:   In Person  Provider:   Quay Burow, MD    Signed, Almyra Deforest, [place]  01/03/2022 11:26 PM    [place]

## 2022-01-03 ENCOUNTER — Encounter: Payer: Self-pay | Admitting: Physician Assistant

## 2022-02-17 DIAGNOSIS — H52222 Regular astigmatism, left eye: Secondary | ICD-10-CM | POA: Diagnosis not present

## 2022-02-17 DIAGNOSIS — H25813 Combined forms of age-related cataract, bilateral: Secondary | ICD-10-CM | POA: Diagnosis not present

## 2022-02-17 DIAGNOSIS — H524 Presbyopia: Secondary | ICD-10-CM | POA: Diagnosis not present

## 2022-02-17 DIAGNOSIS — H353131 Nonexudative age-related macular degeneration, bilateral, early dry stage: Secondary | ICD-10-CM | POA: Diagnosis not present

## 2022-02-17 DIAGNOSIS — Z0100 Encounter for examination of eyes and vision without abnormal findings: Secondary | ICD-10-CM | POA: Diagnosis not present

## 2022-02-17 DIAGNOSIS — H5203 Hypermetropia, bilateral: Secondary | ICD-10-CM | POA: Diagnosis not present

## 2022-03-10 DIAGNOSIS — I495 Sick sinus syndrome: Secondary | ICD-10-CM | POA: Diagnosis not present

## 2022-03-10 DIAGNOSIS — G720 Drug-induced myopathy: Secondary | ICD-10-CM | POA: Diagnosis not present

## 2022-03-10 DIAGNOSIS — E559 Vitamin D deficiency, unspecified: Secondary | ICD-10-CM | POA: Diagnosis not present

## 2022-03-10 DIAGNOSIS — J0141 Acute recurrent pansinusitis: Secondary | ICD-10-CM | POA: Diagnosis not present

## 2022-03-10 DIAGNOSIS — I700 Atherosclerosis of aorta: Secondary | ICD-10-CM | POA: Diagnosis not present

## 2022-03-10 DIAGNOSIS — M8580 Other specified disorders of bone density and structure, unspecified site: Secondary | ICD-10-CM | POA: Diagnosis not present

## 2022-03-10 DIAGNOSIS — E782 Mixed hyperlipidemia: Secondary | ICD-10-CM | POA: Diagnosis not present

## 2022-03-10 DIAGNOSIS — Z79899 Other long term (current) drug therapy: Secondary | ICD-10-CM | POA: Diagnosis not present

## 2022-03-10 DIAGNOSIS — Z0000 Encounter for general adult medical examination without abnormal findings: Secondary | ICD-10-CM | POA: Diagnosis not present

## 2022-03-10 DIAGNOSIS — M8589 Other specified disorders of bone density and structure, multiple sites: Secondary | ICD-10-CM | POA: Diagnosis not present

## 2022-04-08 ENCOUNTER — Other Ambulatory Visit: Payer: Self-pay | Admitting: Cardiovascular Disease

## 2022-05-04 ENCOUNTER — Other Ambulatory Visit: Payer: Self-pay | Admitting: Cardiovascular Disease

## 2022-05-04 NOTE — Telephone Encounter (Signed)
Rx request sent to pharmacy.  

## 2022-05-05 ENCOUNTER — Other Ambulatory Visit: Payer: Self-pay | Admitting: Cardiovascular Disease

## 2022-05-06 ENCOUNTER — Other Ambulatory Visit: Payer: Self-pay | Admitting: Cardiovascular Disease

## 2022-05-06 ENCOUNTER — Other Ambulatory Visit: Payer: Self-pay

## 2022-05-06 MED ORDER — ROSUVASTATIN CALCIUM 5 MG PO TABS: 5.0000 mg | ORAL_TABLET | ORAL | 3 refills | Status: DC

## 2022-05-07 ENCOUNTER — Other Ambulatory Visit: Payer: Self-pay | Admitting: Cardiovascular Disease

## 2022-07-03 ENCOUNTER — Ambulatory Visit: Payer: Medicare HMO | Attending: Cardiovascular Disease | Admitting: Cardiovascular Disease

## 2022-07-03 ENCOUNTER — Encounter: Payer: Self-pay | Admitting: Cardiovascular Disease

## 2022-07-03 VITALS — BP 112/58 | HR 46 | Ht 61.0 in | Wt 120.2 lb

## 2022-07-03 DIAGNOSIS — E782 Mixed hyperlipidemia: Secondary | ICD-10-CM

## 2022-07-03 DIAGNOSIS — I351 Nonrheumatic aortic (valve) insufficiency: Secondary | ICD-10-CM | POA: Diagnosis not present

## 2022-07-03 DIAGNOSIS — E785 Hyperlipidemia, unspecified: Secondary | ICD-10-CM

## 2022-07-03 DIAGNOSIS — R002 Palpitations: Secondary | ICD-10-CM | POA: Diagnosis not present

## 2022-07-03 NOTE — Patient Instructions (Signed)
Medication Instructions:  Your physician recommends that you continue on your current medications as directed. Please refer to the Current Medication list given to you today.  *If you need a refill on your cardiac medications before your next appointment, please call your pharmacy*   Testing/Procedures: Your physician has requested that you have an echocardiogram. Echocardiography is a painless test that uses sound waves to create images of your heart. It provides your doctor with information about the size and shape of your heart and how well your heart's chambers and valves are working. This procedure takes approximately one hour. There are no restrictions for this procedure. Please do NOT wear cologne, perfume, aftershave, or lotions (deodorant is allowed). Please arrive 15 minutes prior to your appointment time. This will take place at [address]. To do in May 2025    Follow-Up: At [facility], you and your health needs are our priority.  As part of our continuing mission to provide you with exceptional heart care, we have created designated Provider Care Teams.  These Care Teams include your primary Cardiologist (physician) and Advanced Practice Providers (APPs -  Physician Assistants and Nurse Practitioners) who all work together to provide you with the care you need, when you need it.  We recommend signing up for the patient portal called "MyChart".  Sign up information is provided on this After Visit Summary.  MyChart is used to connect with patients for Virtual Visits (Telemedicine).  Patients are able to view lab/test results, encounter notes, upcoming appointments, etc.  Non-urgent messages can be sent to your provider as well.   To learn more about what you can do with MyChart, go to [email or web address].    Your next appointment:   12 month(s)  Provider:   Nanetta Batty, MD

## 2022-07-03 NOTE — Assessment & Plan Note (Signed)
History of hyperlipidemia on low-dose statin therapy with lipid profile performed 03/10/2022 revealing a total cholesterol 172, LDL 69 and HDL 77.  Her coronary calcium score performed 12/10/2020 was 0.

## 2022-07-03 NOTE — Assessment & Plan Note (Signed)
History of palpitations which have improved since she stopped caffeine intake with event monitor performed 10/25/2021 revealing occasional PACs, PVCs and short runs of SVT.  She says that these have become much less frequent recently.

## 2022-07-03 NOTE — Assessment & Plan Note (Signed)
2D echocardiogram performed 12/08/2021 revealed normal LV systolic function, moderate AI.  Her echo will be repeated on an annual basis.

## 2022-07-03 NOTE — Progress Notes (Signed)
07/03/2022 [name]   27-Sep-1950  [id number]  Primary Physician Street, Stephanie Coup, MD Primary Cardiologist: Runell Gess MD Nicholes Calamity, [place]  HPI:  Amy Bailey is a 72 y.o.  thin appearing married Caucasian female mother of 1, grandmother of 1 grandchild who is husband Amy Bailey is also a patient of mine.  She was referred by her PCP, Dr. Maryjean Ka, for evaluation of tachypalpitations and a recent event monitor that apparently showed atrial tachycardia.  I last saw her in the office 05/16/2021.  She has no cardiac risk factors.  She is never had a heart attack or stroke.  There is no family history of heart disease except that her brother did die suddenly of unclear causes.  She gets occasional atypical chest pain.  She says that she began to have tachypalpitations a month ago which predated her husband's recent hospitalization.  The palpitations occur fairly frequently, last up to 5 minutes at a time without other associated symptoms.  An event monitor that she wore for 7 days ordered by Dr. Casper Harrison apparently showed atrial tachycardia although I currently have not been able to assess this personally.   I performed 2D echocardiography that revealed normal LV systolic function with moderate AI.  A coronary calcium score was 0.  Her palpitations did somewhat improve on low-dose atenolol, and stopping caffeine intake.  Since I saw her a year ago she is remained stable.  Her palpitations continue to be improved on low-dose atenolol and discontinuing caffeine intake.  She denies chest pain or shortness of breath.   Current Meds  Medication Sig   atenolol (TENORMIN) 25 MG tablet Take 1 tablet (25 mg total) by mouth daily.   Cholecalciferol (VITAMIN D-3 PO) Take by mouth daily.   Docusate Calcium (STOOL SOFTENER PO) Take 2 tablets by mouth daily.   Polyethylene Glycol 3350 (MIRALAX PO) Take by mouth.   rosuvastatin (CRESTOR) 5 MG tablet TAKE 1 TABLET BY MOUTH EVERY  OTHER DAY     No Known Allergies  Social History   Socioeconomic History   Marital status: Married    Spouse name: Not on file   Number of children: Not on file   Years of education: Not on file   Highest education level: Not on file  Occupational History   Not on file  Tobacco Use   Smoking status: Never   Smokeless tobacco: Never  Substance and Sexual Activity   Alcohol use: No   Drug use: No   Sexual activity: Not on file  Other Topics Concern   Not on file  Social History Narrative   She lives with her husband.    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  Cardiovascular: negative for chest pain, dyspnea on exertion, edema, orthopnea, palpitations, paroxysmal nocturnal dyspnea or shortness of breath Dermatological: negative for rash Respiratory: negative for cough or wheezing Urologic: negative for hematuria Abdominal: negative for nausea, vomiting, diarrhea, bright red blood per rectum, melena, or hematemesis Neurologic: negative for visual changes, syncope, or  dizziness All other systems reviewed and are otherwise negative except as noted above.    Blood pressure (!) 112/58, pulse (!) 46, height 5\' 1"  (1.549 m), weight 120 lb 3.2 oz (54.5 kg), SpO2 100 %.  General appearance: alert and no distress Neck: no adenopathy, no carotid bruit, no JVD, supple, symmetrical, trachea midline, and thyroid not enlarged, symmetric, no tenderness/mass/nodules Lungs: clear to auscultation bilaterally Heart: regular rate and rhythm, S1, S2 normal, no murmur, click, rub or  gallop Extremities: extremities normal, atraumatic, no cyanosis or edema Pulses: 2+ and symmetric Skin: Skin color, texture, turgor normal. No rashes or lesions Neurologic: Grossly normal  EKG EKG Interpretation  Date/Time:  Friday July 03 2022 11:33:56 EDT Ventricular Rate:  46 PR Interval:  156 QRS Duration: 84 QT Interval:  450 QTC Calculation: 393 R Axis:   -27 Text Interpretation: Sinus bradycardia When compared with ECG of 10-Jul-2013 19:23, Vent. rate has decreased BY  34 BPM QRS axis Shifted left QT has shortened Confirmed by Nanetta Batty [phone]) on 07/03/2022 12:01:53 PM    ASSESSMENT AND PLAN:   Palpitations History of palpitations which have improved since she stopped caffeine intake with event monitor performed 10/25/2021 revealing occasional PACs, PVCs and short runs of SVT.  She says that these have become much less frequent recently.  Aortic insufficiency 2D echocardiogram performed 12/08/2021 revealed normal LV systolic function, moderate AI.  Her echo will be repeated on an annual basis.  Hyperlipidemia History of hyperlipidemia on low-dose statin therapy with lipid profile performed 03/10/2022 revealing a total cholesterol 172, LDL 69 and HDL 77.  Her coronary calcium score performed 12/10/2020 was 0.     Runell Gess MD FACP,FACC,FAHA, [facility] 07/03/2022 12:08 PM

## 2022-08-14 DIAGNOSIS — R319 Hematuria, unspecified: Secondary | ICD-10-CM | POA: Diagnosis not present

## 2022-08-14 DIAGNOSIS — N3091 Cystitis, unspecified with hematuria: Secondary | ICD-10-CM | POA: Diagnosis not present

## 2022-08-20 DIAGNOSIS — R310 Gross hematuria: Secondary | ICD-10-CM | POA: Diagnosis not present

## 2022-10-21 DIAGNOSIS — Z23 Encounter for immunization: Secondary | ICD-10-CM | POA: Diagnosis not present

## 2022-10-22 DIAGNOSIS — R109 Unspecified abdominal pain: Secondary | ICD-10-CM | POA: Diagnosis not present

## 2022-10-22 DIAGNOSIS — N200 Calculus of kidney: Secondary | ICD-10-CM | POA: Diagnosis not present

## 2022-10-22 DIAGNOSIS — I10 Essential (primary) hypertension: Secondary | ICD-10-CM | POA: Diagnosis not present

## 2022-10-22 DIAGNOSIS — N201 Calculus of ureter: Secondary | ICD-10-CM | POA: Diagnosis not present

## 2022-10-22 DIAGNOSIS — N132 Hydronephrosis with renal and ureteral calculous obstruction: Secondary | ICD-10-CM | POA: Diagnosis not present

## 2022-10-22 DIAGNOSIS — K5750 Diverticulosis of both small and large intestine without perforation or abscess without bleeding: Secondary | ICD-10-CM | POA: Diagnosis not present

## 2022-10-22 DIAGNOSIS — Z87442 Personal history of urinary calculi: Secondary | ICD-10-CM | POA: Diagnosis not present

## 2022-10-22 DIAGNOSIS — N134 Hydroureter: Secondary | ICD-10-CM | POA: Diagnosis not present

## 2022-11-10 DIAGNOSIS — N201 Calculus of ureter: Secondary | ICD-10-CM | POA: Diagnosis not present

## 2022-11-10 DIAGNOSIS — R310 Gross hematuria: Secondary | ICD-10-CM | POA: Diagnosis not present

## 2022-11-26 DIAGNOSIS — Z1231 Encounter for screening mammogram for malignant neoplasm of breast: Secondary | ICD-10-CM | POA: Diagnosis not present

## 2022-11-27 DIAGNOSIS — N201 Calculus of ureter: Secondary | ICD-10-CM | POA: Diagnosis not present

## 2022-11-27 DIAGNOSIS — R310 Gross hematuria: Secondary | ICD-10-CM | POA: Diagnosis not present

## 2022-12-03 DIAGNOSIS — Z1231 Encounter for screening mammogram for malignant neoplasm of breast: Secondary | ICD-10-CM | POA: Diagnosis not present

## 2022-12-30 ENCOUNTER — Other Ambulatory Visit: Payer: Self-pay | Admitting: Physician Assistant

## 2022-12-30 DIAGNOSIS — R002 Palpitations: Secondary | ICD-10-CM

## 2023-01-26 DIAGNOSIS — L03012 Cellulitis of left finger: Secondary | ICD-10-CM | POA: Diagnosis not present

## 2023-01-26 DIAGNOSIS — Z6822 Body mass index (BMI) 22.0-22.9, adult: Secondary | ICD-10-CM | POA: Diagnosis not present

## 2023-01-26 DIAGNOSIS — S29012A Strain of muscle and tendon of back wall of thorax, initial encounter: Secondary | ICD-10-CM | POA: Diagnosis not present

## 2023-03-17 DIAGNOSIS — R7301 Impaired fasting glucose: Secondary | ICD-10-CM | POA: Diagnosis not present

## 2023-03-17 DIAGNOSIS — K582 Mixed irritable bowel syndrome: Secondary | ICD-10-CM | POA: Diagnosis not present

## 2023-03-17 DIAGNOSIS — I700 Atherosclerosis of aorta: Secondary | ICD-10-CM | POA: Diagnosis not present

## 2023-03-17 DIAGNOSIS — M12811 Other specific arthropathies, not elsewhere classified, right shoulder: Secondary | ICD-10-CM | POA: Diagnosis not present

## 2023-03-17 DIAGNOSIS — R001 Bradycardia, unspecified: Secondary | ICD-10-CM | POA: Diagnosis not present

## 2023-03-17 DIAGNOSIS — E782 Mixed hyperlipidemia: Secondary | ICD-10-CM | POA: Diagnosis not present

## 2023-03-17 DIAGNOSIS — Z0000 Encounter for general adult medical examination without abnormal findings: Secondary | ICD-10-CM | POA: Diagnosis not present

## 2023-03-17 DIAGNOSIS — M7551 Bursitis of right shoulder: Secondary | ICD-10-CM | POA: Diagnosis not present

## 2023-03-17 DIAGNOSIS — G720 Drug-induced myopathy: Secondary | ICD-10-CM | POA: Diagnosis not present

## 2023-03-17 DIAGNOSIS — Z79899 Other long term (current) drug therapy: Secondary | ICD-10-CM | POA: Diagnosis not present

## 2023-03-17 DIAGNOSIS — I4719 Other supraventricular tachycardia: Secondary | ICD-10-CM | POA: Diagnosis not present

## 2023-03-17 DIAGNOSIS — I495 Sick sinus syndrome: Secondary | ICD-10-CM | POA: Diagnosis not present

## 2023-03-31 DIAGNOSIS — N201 Calculus of ureter: Secondary | ICD-10-CM | POA: Diagnosis not present

## 2023-03-31 DIAGNOSIS — N952 Postmenopausal atrophic vaginitis: Secondary | ICD-10-CM | POA: Diagnosis not present

## 2023-03-31 DIAGNOSIS — R310 Gross hematuria: Secondary | ICD-10-CM | POA: Diagnosis not present

## 2023-05-19 DIAGNOSIS — Z6822 Body mass index (BMI) 22.0-22.9, adult: Secondary | ICD-10-CM | POA: Diagnosis not present

## 2023-05-19 DIAGNOSIS — M12811 Other specific arthropathies, not elsewhere classified, right shoulder: Secondary | ICD-10-CM | POA: Diagnosis not present

## 2023-05-28 DIAGNOSIS — M25511 Pain in right shoulder: Secondary | ICD-10-CM | POA: Diagnosis not present

## 2023-05-31 ENCOUNTER — Other Ambulatory Visit: Payer: Self-pay | Admitting: Cardiovascular Disease

## 2023-06-22 ENCOUNTER — Other Ambulatory Visit: Payer: Self-pay | Admitting: Physician Assistant

## 2023-06-22 DIAGNOSIS — R002 Palpitations: Secondary | ICD-10-CM

## 2023-06-25 DIAGNOSIS — M25511 Pain in right shoulder: Secondary | ICD-10-CM | POA: Diagnosis not present

## 2023-06-29 ENCOUNTER — Ambulatory Visit (HOSPITAL_COMMUNITY)
Admission: RE | Admit: 2023-06-29 | Discharge: 2023-06-29 | Disposition: A | Discharge status: Home / Self Care | Payer: Medicare HMO | Source: Ambulatory Visit | Attending: Cardiovascular Disease | Admitting: Cardiovascular Disease

## 2023-06-29 ENCOUNTER — Ambulatory Visit: Payer: Self-pay | Admitting: Cardiovascular Disease

## 2023-06-29 DIAGNOSIS — I351 Nonrheumatic aortic (valve) insufficiency: Secondary | ICD-10-CM

## 2023-06-29 DIAGNOSIS — R002 Palpitations: Secondary | ICD-10-CM

## 2023-06-29 DIAGNOSIS — E785 Hyperlipidemia, unspecified: Secondary | ICD-10-CM

## 2023-06-29 LAB — ECHOCARDIOGRAM COMPLETE
Area-P 1/2: 2.66 cm2
P 1/2 time: 595 ms
S' Lateral: 2.9 cm

## 2023-07-05 ENCOUNTER — Telehealth: Payer: Self-pay | Admitting: *Deleted

## 2023-07-05 DIAGNOSIS — M25511 Pain in right shoulder: Secondary | ICD-10-CM | POA: Diagnosis not present

## 2023-07-05 NOTE — Telephone Encounter (Signed)
   Pre-operative Risk Assessment    Patient Name: Amy Bailey  DOB: 03/22/1950 MRN: [id number]   Date of last office visit: 06/2022 Date of next office visit: N/A   Request for Surgical Clearance    Procedure:  REVISION ROTATOR CUFF REPAIR RIGHT SHOULDER  Date of Surgery:  Clearance TBD                                Surgeon:  DR. REYES BILLING Surgeon's Group or Practice Name:  [facility] Phone number:  [phone] Fax number:  [phone]   Type of Clearance Requested:   - Medical    Type of Anesthesia:  Not Indicated   Additional requests/questions:    [facility]   07/05/2023, 10:15 AM

## 2023-07-05 NOTE — Telephone Encounter (Signed)
    Primary Cardiologist:Jonathan Court, MD  Chart reviewed as part of pre-operative protocol coverage. Because of Amy Bailey's past medical history and time since last visit, he/she will require a follow-up visit in order to better assess preoperative cardiovascular risk.  Pre-op covering staff: - Please schedule in office appointment and call patient to inform them. - Please contact requesting surgeon's office via preferred method (i.e, phone, fax) to inform them of need for appointment prior to surgery.  If applicable, this message will also be routed to pharmacy pool and/or primary cardiologist for input on holding anticoagulant/antiplatelet agent as requested below so that this information is available at time of patient's appointment.   Amy CHRISTELLA Beauvais, NP  07/05/2023, 11:27 AM

## 2023-07-05 NOTE — Telephone Encounter (Signed)
 Pt has been scheduled for an appointment 07/09/23, clearance will be addressed at that time.  Will route to requesting surgeon's office to make them aware.

## 2023-07-05 NOTE — Telephone Encounter (Signed)
1st attempt to reach pt regarding surgical clearance and the need for an IN OFFICE appointment. Left a message for pt to call back and get that scheduled.

## 2023-07-05 NOTE — Progress Notes (Signed)
 Cardiology Office Note:    Date:  07/09/2023   ID:  Amy Bailey 18-Feb-1950, MRN [id number]  PCP:  Street, Amy CHRISTELLA, MD   [facility] Providers Cardiologist:  Amy Lesches, MD { Referring MD: Street, Amy Bailey, *   Chief Complaint  Patient presents with   Pre-op Exam    History of Present Illness:    Amy Bailey is a 73 y.o. female with a hx of HLD and palpitations. No hx of MI or CVA. Echo 2023 with LVEF 65-70% mild BAE, mild to moderate AI. Heart monitor for palpitations showed 7 episodes of SVT and isolated PVCs less than 1% burden. Baseline bradycardia precluded increasing atenolol . Coronary calcium  score zero in 2022.   Last echo 06/2023 showed preserved LVEF 55-60% with grade 1 DD.   She remains active - before her shoulder pain, she was weed eating the yard and continues to care for her husband. No cardiac issues.   Past Medical History:  Diagnosis Date   Hypercholesteremia     Past Surgical History:  Procedure Laterality Date   APPENDECTOMY     NECK SURGERY     RADIOACTIVE SEED GUIDED EXCISIONAL BREAST BIOPSY Right 05/01/2020   Procedure: RIGHT BREAST SEED LOCALIZED EXCISIONAL BIOPSY;  Surgeon: Amy Shoulders, MD;  Location: [facility];  Service: General;  Laterality: Right;   SHOULDER SURGERY      Current Medications: Current Meds  Medication Sig   atenolol  (TENORMIN ) 25 MG tablet Take 1 tablet by mouth once daily   Calcium  Carb-Cholecalciferol (CALCIUM  600 + D PO) Take 1 tablet by mouth daily.   Cholecalciferol (VITAMIN D-3 PO) Take by mouth daily.   Docusate Calcium  (STOOL SOFTENER PO) Take 2 tablets by mouth daily.   Polyethylene Glycol 3350 (MIRALAX PO) Take by mouth.   rosuvastatin  (CRESTOR ) 5 MG tablet TAKE 1 TABLET BY MOUTH EVERY OTHER DAY   traMADol  (ULTRAM ) 50 MG tablet Take 50 mg by mouth at bedtime.     Allergies:   Patient has no known allergies.   Social History   Socioeconomic History   Marital  status: Married    Spouse name: Not on file   Number of children: Not on file   Years of education: Not on file   Highest education level: Not on file  Occupational History   Not on file  Tobacco Use   Smoking status: Never   Smokeless tobacco: Never  Substance and Sexual Activity   Alcohol use: No   Drug use: No   Sexual activity: Not on file  Other Topics Concern   Not on file  Social History Narrative   She lives with her husband.    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  ROS:   Please see the history of present illness.     All other systems reviewed and are negative.  EKGs/Labs/Other Studies Reviewed:    The following studies were reviewed today:  EKG Interpretation Date/Time:  Friday July 09 2023 10:30:28 EDT Ventricular Rate:  50 PR Interval:  148 QRS Duration:  84 QT Interval:  438 QTC Calculation: 399 R Axis:   -25  Text Interpretation: Sinus bradycardia When compared with ECG of 03-Jul-2022 11:33, No significant change was found Confirmed by Amy Bailey ([id number]) on 07/09/2023 11:06:22 AM    Recent Labs: No results found for requested labs within last 365 days.  Recent Lipid Panel    Component Value Date/Time   CHOL 167 06/03/2021 0946   TRIG 58 06/03/2021 0946   HDL 86 06/03/2021 0946   CHOLHDL 1.9 06/03/2021 0946   LDLCALC 69 06/03/2021 0946     Risk Assessment/Calculations:                Physical Exam:    VS:  BP (!) 138/54 (BP Location: Left Arm, Patient Position:  Sitting, Cuff Size: Normal)   Pulse (!) 50   Ht 5' 1 (1.549 m)   Wt 120 lb 3.2 oz (54.5 kg)   SpO2 99%   BMI 22.71 kg/m     Wt Readings from Last 3 Encounters:  07/09/23 120 lb 3.2 oz (54.5 kg)  07/03/22 120 lb 3.2 oz (54.5 kg)  01/01/22 119 lb 12.8 oz (54.3 kg)     GEN:  Well nourished, well developed in no acute distress HEENT: Normal NECK: No JVD; No carotid bruits LYMPHATICS: No lymphadenopathy CARDIAC: RRR, no murmurs, rubs, gallops RESPIRATORY:  Clear to auscultation without rales, wheezing or rhonchi  ABDOMEN: Soft, non-tender, non-distended MUSCULOSKELETAL:  No edema; No deformity  SKIN: Warm and dry NEUROLOGIC:  Alert and oriented x 3 PSYCHIATRIC:  Normal affect   ASSESSMENT:    1. Pre-op evaluation   2. Moderate aortic insufficiency   3. Palpitations   4. Nonrheumatic aortic valve insufficiency   5. Primary hypertension   6. Hyperlipidemia with target LDL less than 70   7. Preoperative cardiovascular examination    PLAN:    In order of problems listed above:  Palpitations Asymptomatic bradycardia - paroxysmal SVT - on BB - no syncope   Hypertension - continue atenolol    Moderate AI - echo 2025 showed moderate AI - follow echo 06/2024  - no symptoms   Hyperlipidemia - checking lipids with PCP - continue 5 mg crestor    Preoperative risk evaluation for MACE prior to  She has no history of ischemic heart disease, stroke, or heart failure.  According to the RCRI she has a 0.6% risk of Mace for any procedure.  She has no unstable cardiac conditions.  She can complete more than 4.0 METS without angina.  No further cardiac testing required prior to surgery.  Fax [phone]  [facility]      Follow up in 1 year.      Medication Adjustments/Labs and Tests Ordered: Current medicines are reviewed at length with the patient today.  Concerns regarding medicines are outlined above.  Orders Placed This Encounter  Procedures   EKG 12-Lead    ECHOCARDIOGRAM COMPLETE   No orders of the defined types were placed in this encounter.   Patient Instructions  Medication Instructions:  No medication changes were made during today's visit.  *If you need a refill on your cardiac medications before your next appointment, please call your pharmacy*   Lab Work: No labs were ordered during today's visit.  If you have labs (blood work) drawn today and your tests are completely normal, you will receive your results only by: MyChart Message (if you  have MyChart) OR A paper copy in the mail If you have any lab test that is abnormal or we need to change your treatment, we will call you to review the results.   Testing/Procedures: Your physician has requested that you have an echocardiogram in June 2026. Echocardiography is a painless test that uses sound waves to create images of your heart. It provides your doctor with information about the size and shape of your heart and how well your heart's chambers and valves are working. This procedure takes approximately one hour. There are no restrictions for this procedure. Please do NOT wear cologne, perfume, aftershave, or lotions (deodorant is allowed). Please arrive 15 minutes prior to your appointment time.  Please note: We ask at that you not bring children with you during ultrasound (echo/ vascular) testing. Due to room size and safety concerns, children are not allowed in the ultrasound rooms during exams. Our front office staff cannot provide observation of children in our lobby area while testing is being conducted. An adult accompanying a patient to their appointment will only be allowed in the ultrasound room at the discretion of the ultrasound technician under special circumstances. We apologize for any inconvenience.    Follow-Up: At [facility], you and your health needs are our priority.  As part of our continuing mission to provide you with exceptional heart care, we have  created designated Provider Care Teams.  These Care Teams include your primary Cardiologist (physician) and Advanced Practice Providers (APPs -  Physician Assistants and Nurse Practitioners) who all work together to provide you with the care you need, when you need it.  We recommend signing up for the patient portal called MyChart.  Sign up information is provided on this After Visit Summary.  MyChart is used to connect with patients for Virtual Visits (Telemedicine).  Patients are able to view lab/test results, encounter notes, upcoming appointments, etc.  Non-urgent messages can be sent to your provider as well.   To learn more about what you can do with MyChart, go to [email or web address].    Your next appointment:  after echo in June 2026 1 year(s)  Provider:   Peter Swaziland, MD    Other Instructions Thank you for choosing [facility]!       Signed, Jon Nat Hails, [place]  07/09/2023 12:01 PM     [facility]

## 2023-07-09 ENCOUNTER — Ambulatory Visit: Attending: Physician Assistant | Admitting: Physician Assistant

## 2023-07-09 ENCOUNTER — Encounter: Payer: Self-pay | Admitting: Physician Assistant

## 2023-07-09 VITALS — BP 138/54 | HR 50 | Ht 61.0 in | Wt 120.2 lb

## 2023-07-09 DIAGNOSIS — I10 Essential (primary) hypertension: Secondary | ICD-10-CM

## 2023-07-09 DIAGNOSIS — Z01818 Encounter for other preprocedural examination: Secondary | ICD-10-CM

## 2023-07-09 DIAGNOSIS — E785 Hyperlipidemia, unspecified: Secondary | ICD-10-CM | POA: Diagnosis not present

## 2023-07-09 DIAGNOSIS — R002 Palpitations: Secondary | ICD-10-CM | POA: Diagnosis not present

## 2023-07-09 DIAGNOSIS — I351 Nonrheumatic aortic (valve) insufficiency: Secondary | ICD-10-CM

## 2023-07-09 DIAGNOSIS — Z01810 Encounter for preprocedural cardiovascular examination: Secondary | ICD-10-CM

## 2023-07-09 NOTE — Patient Instructions (Addendum)
 Medication Instructions:  No medication changes were made during today's visit.  *If you need a refill on your cardiac medications before your next appointment, please call your pharmacy*   Lab Work: No labs were ordered during today's visit.  If you have labs (blood work) drawn today and your tests are completely normal, you will receive your results only by: MyChart Message (if you have MyChart) OR A paper copy in the mail If you have any lab test that is abnormal or we need to change your treatment, we will call you to review the results.   Testing/Procedures: Your physician has requested that you have an echocardiogram in June 2026. Echocardiography is a painless test that uses sound waves to create images of your heart. It provides your doctor with information about the size and shape of your heart and how well your heart's chambers and valves are working. This procedure takes approximately one hour. There are no restrictions for this procedure. Please do NOT wear cologne, perfume, aftershave, or lotions (deodorant is allowed). Please arrive 15 minutes prior to your appointment time.  Please note: We ask at that you not bring children with you during ultrasound (echo/ vascular) testing. Due to room size and safety concerns, children are not allowed in the ultrasound rooms during exams. Our front office staff cannot provide observation of children in our lobby area while testing is being conducted. An adult accompanying a patient to their appointment will only be allowed in the ultrasound room at the discretion of the ultrasound technician under special circumstances. We apologize for any inconvenience.    Follow-Up: At [facility], you and your health needs are our priority.  As part of our continuing mission to provide you with exceptional heart care, we have created designated Provider Care Teams.  These Care Teams include your primary Cardiologist (physician) and Advanced  Practice Providers (APPs -  Physician Assistants and Nurse Practitioners) who all work together to provide you with the care you need, when you need it.  We recommend signing up for the patient portal called MyChart.  Sign up information is provided on this After Visit Summary.  MyChart is used to connect with patients for Virtual Visits (Telemedicine).  Patients are able to view lab/test results, encounter notes, upcoming appointments, etc.  Non-urgent messages can be sent to your provider as well.   To learn more about what you can do with MyChart, go to [email or web address].    Your next appointment:  after echo in June 2026 1 year(s)  Provider:   Peter Swaz[place], MD    Other Instructions Thank you for choosing [facility]!

## 2023-07-26 DIAGNOSIS — X58XXXA Exposure to other specified factors, initial encounter: Secondary | ICD-10-CM | POA: Diagnosis not present

## 2023-07-26 DIAGNOSIS — M7541 Impingement syndrome of right shoulder: Secondary | ICD-10-CM | POA: Diagnosis not present

## 2023-07-26 DIAGNOSIS — G8918 Other acute postprocedural pain: Secondary | ICD-10-CM | POA: Diagnosis not present

## 2023-07-26 DIAGNOSIS — Y999 Unspecified external cause status: Secondary | ICD-10-CM | POA: Diagnosis not present

## 2023-07-26 DIAGNOSIS — M7551 Bursitis of right shoulder: Secondary | ICD-10-CM | POA: Diagnosis not present

## 2023-07-26 DIAGNOSIS — S46011A Strain of muscle(s) and tendon(s) of the rotator cuff of right shoulder, initial encounter: Secondary | ICD-10-CM | POA: Diagnosis not present

## 2023-08-25 ENCOUNTER — Other Ambulatory Visit: Payer: Self-pay | Admitting: Cardiovascular Disease

## 2023-08-26 DIAGNOSIS — M542 Cervicalgia: Secondary | ICD-10-CM | POA: Diagnosis not present

## 2023-08-26 DIAGNOSIS — Z4889 Encounter for other specified surgical aftercare: Secondary | ICD-10-CM | POA: Diagnosis not present

## 2023-08-26 DIAGNOSIS — M791 Myalgia, unspecified site: Secondary | ICD-10-CM | POA: Diagnosis not present

## 2023-09-01 DIAGNOSIS — M25611 Stiffness of right shoulder, not elsewhere classified: Secondary | ICD-10-CM | POA: Diagnosis not present

## 2023-09-01 DIAGNOSIS — R29898 Other symptoms and signs involving the musculoskeletal system: Secondary | ICD-10-CM | POA: Diagnosis not present

## 2023-09-01 DIAGNOSIS — M25511 Pain in right shoulder: Secondary | ICD-10-CM | POA: Diagnosis not present

## 2023-09-03 DIAGNOSIS — R29898 Other symptoms and signs involving the musculoskeletal system: Secondary | ICD-10-CM | POA: Diagnosis not present

## 2023-09-03 DIAGNOSIS — M25611 Stiffness of right shoulder, not elsewhere classified: Secondary | ICD-10-CM | POA: Diagnosis not present

## 2023-09-03 DIAGNOSIS — M25511 Pain in right shoulder: Secondary | ICD-10-CM | POA: Diagnosis not present

## 2023-09-06 DIAGNOSIS — M25511 Pain in right shoulder: Secondary | ICD-10-CM | POA: Diagnosis not present

## 2023-09-06 DIAGNOSIS — R29898 Other symptoms and signs involving the musculoskeletal system: Secondary | ICD-10-CM | POA: Diagnosis not present

## 2023-09-06 DIAGNOSIS — M25611 Stiffness of right shoulder, not elsewhere classified: Secondary | ICD-10-CM | POA: Diagnosis not present

## 2023-09-10 DIAGNOSIS — R29898 Other symptoms and signs involving the musculoskeletal system: Secondary | ICD-10-CM | POA: Diagnosis not present

## 2023-09-10 DIAGNOSIS — M25511 Pain in right shoulder: Secondary | ICD-10-CM | POA: Diagnosis not present

## 2023-09-10 DIAGNOSIS — M25611 Stiffness of right shoulder, not elsewhere classified: Secondary | ICD-10-CM | POA: Diagnosis not present

## 2023-09-14 DIAGNOSIS — M25611 Stiffness of right shoulder, not elsewhere classified: Secondary | ICD-10-CM | POA: Diagnosis not present

## 2023-09-14 DIAGNOSIS — M25511 Pain in right shoulder: Secondary | ICD-10-CM | POA: Diagnosis not present

## 2023-09-14 DIAGNOSIS — R29898 Other symptoms and signs involving the musculoskeletal system: Secondary | ICD-10-CM | POA: Diagnosis not present

## 2023-09-16 DIAGNOSIS — R29898 Other symptoms and signs involving the musculoskeletal system: Secondary | ICD-10-CM | POA: Diagnosis not present

## 2023-09-16 DIAGNOSIS — M25611 Stiffness of right shoulder, not elsewhere classified: Secondary | ICD-10-CM | POA: Diagnosis not present

## 2023-09-16 DIAGNOSIS — M25511 Pain in right shoulder: Secondary | ICD-10-CM | POA: Diagnosis not present

## 2023-09-21 DIAGNOSIS — M25611 Stiffness of right shoulder, not elsewhere classified: Secondary | ICD-10-CM | POA: Diagnosis not present

## 2023-09-21 DIAGNOSIS — R29898 Other symptoms and signs involving the musculoskeletal system: Secondary | ICD-10-CM | POA: Diagnosis not present

## 2023-09-21 DIAGNOSIS — M25511 Pain in right shoulder: Secondary | ICD-10-CM | POA: Diagnosis not present

## 2023-09-23 DIAGNOSIS — M25611 Stiffness of right shoulder, not elsewhere classified: Secondary | ICD-10-CM | POA: Diagnosis not present

## 2023-09-23 DIAGNOSIS — R29898 Other symptoms and signs involving the musculoskeletal system: Secondary | ICD-10-CM | POA: Diagnosis not present

## 2023-09-23 DIAGNOSIS — M25511 Pain in right shoulder: Secondary | ICD-10-CM | POA: Diagnosis not present

## 2023-09-27 ENCOUNTER — Other Ambulatory Visit: Payer: Self-pay | Admitting: Cardiovascular Disease

## 2023-09-27 DIAGNOSIS — R002 Palpitations: Secondary | ICD-10-CM

## 2023-09-28 DIAGNOSIS — M25611 Stiffness of right shoulder, not elsewhere classified: Secondary | ICD-10-CM | POA: Diagnosis not present

## 2023-09-28 DIAGNOSIS — M25511 Pain in right shoulder: Secondary | ICD-10-CM | POA: Diagnosis not present

## 2023-09-28 DIAGNOSIS — R29898 Other symptoms and signs involving the musculoskeletal system: Secondary | ICD-10-CM | POA: Diagnosis not present

## 2023-09-28 MED ORDER — ATENOLOL 25 MG PO TABS: 25.0000 mg | ORAL_TABLET | Freq: Every day | ORAL | 2 refills | Status: AC

## 2023-09-30 DIAGNOSIS — M25511 Pain in right shoulder: Secondary | ICD-10-CM | POA: Diagnosis not present

## 2023-09-30 DIAGNOSIS — R29898 Other symptoms and signs involving the musculoskeletal system: Secondary | ICD-10-CM | POA: Diagnosis not present

## 2023-09-30 DIAGNOSIS — M25611 Stiffness of right shoulder, not elsewhere classified: Secondary | ICD-10-CM | POA: Diagnosis not present

## 2023-10-05 DIAGNOSIS — Z23 Encounter for immunization: Secondary | ICD-10-CM | POA: Diagnosis not present

## 2023-10-05 DIAGNOSIS — Z4889 Encounter for other specified surgical aftercare: Secondary | ICD-10-CM | POA: Diagnosis not present

## 2023-10-07 DIAGNOSIS — M25511 Pain in right shoulder: Secondary | ICD-10-CM | POA: Diagnosis not present

## 2023-10-07 DIAGNOSIS — M25611 Stiffness of right shoulder, not elsewhere classified: Secondary | ICD-10-CM | POA: Diagnosis not present

## 2023-10-07 DIAGNOSIS — R29898 Other symptoms and signs involving the musculoskeletal system: Secondary | ICD-10-CM | POA: Diagnosis not present

## 2023-10-12 DIAGNOSIS — R29898 Other symptoms and signs involving the musculoskeletal system: Secondary | ICD-10-CM | POA: Diagnosis not present

## 2023-10-12 DIAGNOSIS — M25511 Pain in right shoulder: Secondary | ICD-10-CM | POA: Diagnosis not present

## 2023-10-12 DIAGNOSIS — M25611 Stiffness of right shoulder, not elsewhere classified: Secondary | ICD-10-CM | POA: Diagnosis not present

## 2023-10-14 DIAGNOSIS — M25611 Stiffness of right shoulder, not elsewhere classified: Secondary | ICD-10-CM | POA: Diagnosis not present

## 2023-10-14 DIAGNOSIS — R29898 Other symptoms and signs involving the musculoskeletal system: Secondary | ICD-10-CM | POA: Diagnosis not present

## 2023-10-14 DIAGNOSIS — M25511 Pain in right shoulder: Secondary | ICD-10-CM | POA: Diagnosis not present

## 2023-10-16 DIAGNOSIS — L209 Atopic dermatitis, unspecified: Secondary | ICD-10-CM | POA: Diagnosis not present

## 2023-10-16 DIAGNOSIS — L03012 Cellulitis of left finger: Secondary | ICD-10-CM | POA: Diagnosis not present

## 2023-10-21 DIAGNOSIS — M25511 Pain in right shoulder: Secondary | ICD-10-CM | POA: Diagnosis not present

## 2023-10-21 DIAGNOSIS — R29898 Other symptoms and signs involving the musculoskeletal system: Secondary | ICD-10-CM | POA: Diagnosis not present

## 2023-10-21 DIAGNOSIS — M25611 Stiffness of right shoulder, not elsewhere classified: Secondary | ICD-10-CM | POA: Diagnosis not present

## 2023-10-25 DIAGNOSIS — R519 Headache, unspecified: Secondary | ICD-10-CM | POA: Diagnosis not present

## 2023-10-26 DIAGNOSIS — M25611 Stiffness of right shoulder, not elsewhere classified: Secondary | ICD-10-CM | POA: Diagnosis not present

## 2023-10-26 DIAGNOSIS — M25511 Pain in right shoulder: Secondary | ICD-10-CM | POA: Diagnosis not present

## 2023-10-26 DIAGNOSIS — R29898 Other symptoms and signs involving the musculoskeletal system: Secondary | ICD-10-CM | POA: Diagnosis not present

## 2023-12-08 DIAGNOSIS — K2960 Other gastritis without bleeding: Secondary | ICD-10-CM | POA: Diagnosis not present

## 2023-12-08 DIAGNOSIS — Z6821 Body mass index (BMI) 21.0-21.9, adult: Secondary | ICD-10-CM | POA: Diagnosis not present

## 2024-07-07 ENCOUNTER — Other Ambulatory Visit (HOSPITAL_COMMUNITY)
